# Patient Record
Sex: Female | Born: 1970 | Race: Black or African American | Hispanic: No | Marital: Single | Smoking: Never smoker
Health system: Southern US, Community
[De-identification: ages and names within clinical notes are randomized; demographics above are authoritative.]

## PROBLEM LIST (undated history)

## (undated) DIAGNOSIS — B0221 Postherpetic geniculate ganglionitis: Secondary | ICD-10-CM

## (undated) DIAGNOSIS — D249 Benign neoplasm of unspecified breast: Secondary | ICD-10-CM

## (undated) DIAGNOSIS — R011 Cardiac murmur, unspecified: Secondary | ICD-10-CM

## (undated) DIAGNOSIS — K219 Gastro-esophageal reflux disease without esophagitis: Secondary | ICD-10-CM

## (undated) DIAGNOSIS — I10 Essential (primary) hypertension: Secondary | ICD-10-CM

## (undated) DIAGNOSIS — R519 Headache, unspecified: Secondary | ICD-10-CM

## (undated) DIAGNOSIS — B029 Zoster without complications: Secondary | ICD-10-CM

## (undated) HISTORY — PX: NO PAST SURGERIES: SHX2092

## (undated) HISTORY — DX: Benign neoplasm of unspecified breast: D24.9

## (undated) HISTORY — DX: Cardiac murmur, unspecified: R01.1

## (undated) HISTORY — PX: ENDOMETRIAL CRYOABLATION: SHX1499

---

## 1998-05-28 ENCOUNTER — Other Ambulatory Visit: Admission: RE | Admit: 1998-05-28 | Discharge: 1998-05-28 | Payer: Self-pay | Admitting: Emergency Medicine

## 1999-06-06 ENCOUNTER — Other Ambulatory Visit: Admission: RE | Admit: 1999-06-06 | Discharge: 1999-06-06 | Payer: Self-pay | Admitting: Family Medicine

## 1999-08-04 ENCOUNTER — Emergency Department (HOSPITAL_COMMUNITY): Admission: EM | Admit: 1999-08-04 | Discharge: 1999-08-04 | Payer: Self-pay | Admitting: *Deleted

## 2000-06-10 ENCOUNTER — Other Ambulatory Visit: Admission: RE | Admit: 2000-06-10 | Discharge: 2000-06-10 | Payer: Self-pay | Admitting: Family Medicine

## 2001-06-21 ENCOUNTER — Other Ambulatory Visit: Admission: RE | Admit: 2001-06-21 | Discharge: 2001-06-21 | Payer: Self-pay | Admitting: Family Medicine

## 2002-06-24 ENCOUNTER — Other Ambulatory Visit: Admission: RE | Admit: 2002-06-24 | Discharge: 2002-06-24 | Payer: Self-pay | Admitting: Family Medicine

## 2003-06-26 ENCOUNTER — Other Ambulatory Visit: Admission: RE | Admit: 2003-06-26 | Discharge: 2003-06-26 | Payer: Self-pay | Admitting: Family Medicine

## 2005-02-05 ENCOUNTER — Other Ambulatory Visit: Admission: RE | Admit: 2005-02-05 | Discharge: 2005-02-05 | Payer: Self-pay | Admitting: Family Medicine

## 2006-02-18 ENCOUNTER — Other Ambulatory Visit: Admission: RE | Admit: 2006-02-18 | Discharge: 2006-02-18 | Payer: Self-pay | Admitting: Family Medicine

## 2007-02-22 ENCOUNTER — Other Ambulatory Visit: Admission: RE | Admit: 2007-02-22 | Discharge: 2007-02-22 | Payer: Self-pay | Admitting: Family Medicine

## 2008-02-23 ENCOUNTER — Other Ambulatory Visit: Admission: RE | Admit: 2008-02-23 | Discharge: 2008-02-23 | Payer: Self-pay | Admitting: Family Medicine

## 2011-01-14 HISTORY — PX: BREAST EXCISIONAL BIOPSY: SUR124

## 2011-04-30 ENCOUNTER — Other Ambulatory Visit: Payer: Self-pay | Admitting: Family Medicine

## 2011-04-30 DIAGNOSIS — Z1231 Encounter for screening mammogram for malignant neoplasm of breast: Secondary | ICD-10-CM

## 2011-05-22 ENCOUNTER — Ambulatory Visit: Payer: Self-pay

## 2011-05-29 ENCOUNTER — Ambulatory Visit
Admission: RE | Admit: 2011-05-29 | Discharge: 2011-05-29 | Disposition: A | Discharge status: Home / Self Care | Payer: Federal, State, Local not specified - PPO | Source: Ambulatory Visit | Attending: Family Medicine | Admitting: Family Medicine

## 2011-05-29 DIAGNOSIS — Z1231 Encounter for screening mammogram for malignant neoplasm of breast: Secondary | ICD-10-CM

## 2011-06-11 ENCOUNTER — Other Ambulatory Visit: Payer: Self-pay | Admitting: Family Medicine

## 2011-06-11 DIAGNOSIS — R928 Other abnormal and inconclusive findings on diagnostic imaging of breast: Secondary | ICD-10-CM

## 2011-06-20 ENCOUNTER — Ambulatory Visit
Admission: RE | Admit: 2011-06-20 | Discharge: 2011-06-20 | Disposition: A | Discharge status: Home / Self Care | Payer: Federal, State, Local not specified - PPO | Source: Ambulatory Visit | Attending: Family Medicine | Admitting: Family Medicine

## 2011-06-20 DIAGNOSIS — R928 Other abnormal and inconclusive findings on diagnostic imaging of breast: Secondary | ICD-10-CM

## 2011-07-15 ENCOUNTER — Encounter (INDEPENDENT_AMBULATORY_CARE_PROVIDER_SITE_OTHER): Payer: Self-pay | Admitting: Surgery

## 2011-07-15 ENCOUNTER — Ambulatory Visit (INDEPENDENT_AMBULATORY_CARE_PROVIDER_SITE_OTHER): Payer: Federal, State, Local not specified - PPO | Admitting: Surgery

## 2011-07-15 VITALS — BP 132/86 | HR 70 | Temp 97.4°F | Resp 14 | Ht 63.5 in | Wt 186.5 lb

## 2011-07-15 DIAGNOSIS — N63 Unspecified lump in unspecified breast: Secondary | ICD-10-CM

## 2011-07-15 NOTE — Patient Instructions (Addendum)
We will schedule the surgery to remove the small lump from your left breast. If you have any questions he has before surgery please be sure to call the office.

## 2011-07-15 NOTE — Progress Notes (Signed)
NAME: Caroline Novak DOB: 05-Jun-1970 MRN: [id number]                                                                                      DATE: 07/15/2011  PCP: Joycelyn Rua, MD Referring Provider: Henrene Pastor, MD  IMPRESSION:  Left breast mass, 6:00 position, probable fibroadenoma  PLAN:   Excision. This is symptomatic and she does not wish to have a biopsy since it looks benign by mammogram and ultrasound criteria and she wishes to have it removed whether or not she has a benign biopsy. I've discussed the surgery risks and center with her and all questions have been answered.                 CC:  Chief Complaint  Patient presents with  . Breast Mass    Fibroadenoma    HPI:  Caroline Novak is a 41 y.o.  female who presents for evaluation of Left breast mass.Skin present over 3 years.  she has several masses 2 on the left side and one on the right side. The one in the 6:00 position on the left has been uncomfortable it bothers her. She has had mammograms and ultrasounds and this really appears to be a solid tumor likely a fibroadenoma or a fibro-adenoma lipoma. The other 2 masses appear to be simple cysts. She's had no prior breast problems. PMH:  has a past medical history of Heart murmur and Fibroadenoma of breast.  PSH:   has no past surgical history on file.  ALLERGIES:  No Known Allergies  MEDICATIONS: Current outpatient prescriptions:lisinopril-hydrochlorothiazide (PRINZIDE,ZESTORETIC) 10-12.5 MG per tablet, Daily., Disp: , Rfl: ;  NON FORMULARY, daily. Synedrex - diet supplement., Disp: , Rfl: ;  TRI-PREVIFEM 0.18/0.215/0.25 MG-35 MCG tablet, Daily., Disp: , Rfl:   ROS: Basically negative.  EXAM:   VITAL SIGNS: BP 132/86  Pulse 70  Temp 97.4 F (36.3 C) (Temporal)  Resp 14  Ht 5' 3.5" (1.613 m)  Wt 186 lb 8 oz (84.596 kg)  BMI 32.52 kg/m2 GENERAL:  The patient is alert, oriented, and generally healthy-appearing, NAD. Mood and affect are normal.  HEENT:  The  head is normocephalic, the eyes nonicteric, the pupils were round regular and equal. EOMs are normal. Pharynx normal. Dentition good.  NECK:  The neck is supple and there are no masses or thyromegaly.  LUNGS: Normal respirations and clear to auscultation.  HEART: Regular rhythm, with no murmurs rubs or gallops. Pulses are intact carotid dorsalis pedis and posterior tibial. No significant varicosities are noted.  BREASTS: The breasts are symmetric in size and shape. There is a well-circumscribed 2 cm mass in the right breast about the 1:30 position about 2 fingerbreadths above the areolar margin. It is consistent with a cyst. There is an almost mirror image mass in the left breast. In the left breast there is another mass in the 6:00 position which is about 1.5 cm, well circumscribed, mobile, slightly tender.  LYMPHATICS: There is no axillary or supraclavicular adenopathy noted.  ABDOMEN: Soft, flat, and nontender. No masses or organomegaly is noted. No hernias are noted. Bowel sounds are normal.  EXTREMITIES:  Good range of motion, no edema.   DATA REVIEWED:  Mammogram and ultrasound reports are noted and reviewed.    Annastasia Haskins J 07/15/2011  CC: Brozzetti, Robinette Haines, MD, Joycelyn Rua, MD

## 2011-08-13 ENCOUNTER — Encounter (HOSPITAL_BASED_OUTPATIENT_CLINIC_OR_DEPARTMENT_OTHER): Payer: Self-pay | Admitting: *Deleted

## 2011-08-13 NOTE — Progress Notes (Signed)
To come in for bmet-ekg for htn

## 2011-08-18 ENCOUNTER — Encounter (HOSPITAL_BASED_OUTPATIENT_CLINIC_OR_DEPARTMENT_OTHER): Payer: Self-pay | Admitting: *Deleted

## 2011-08-18 ENCOUNTER — Encounter (HOSPITAL_BASED_OUTPATIENT_CLINIC_OR_DEPARTMENT_OTHER)
Admission: RE | Admit: 2011-08-18 | Discharge: 2011-08-18 | Disposition: A | Discharge status: Home / Self Care | Payer: Federal, State, Local not specified - PPO | Source: Ambulatory Visit | Attending: Surgery | Admitting: Surgery

## 2011-08-18 LAB — BASIC METABOLIC PANEL
BUN: 10 mg/dL (ref 6–23)
CO2: 25 mEq/L (ref 19–32)
Calcium: 8.7 mg/dL (ref 8.4–10.5)
Chloride: 101 mEq/L (ref 96–112)
Creatinine, Ser: 0.93 mg/dL (ref 0.50–1.10)
GFR calc Af Amer: 86 mL/min — ABNORMAL LOW (ref >90)
GFR calc non Af Amer: 74 mL/min — ABNORMAL LOW (ref >90)
Glucose, Bld: 85 mg/dL (ref 70–99)
Potassium: 3.4 mEq/L — ABNORMAL LOW (ref 3.5–5.1)
Sodium: 137 mEq/L (ref 135–145)

## 2011-08-19 ENCOUNTER — Encounter (HOSPITAL_BASED_OUTPATIENT_CLINIC_OR_DEPARTMENT_OTHER): Payer: Self-pay | Admitting: Anesthesiology

## 2011-08-19 ENCOUNTER — Ambulatory Visit (HOSPITAL_BASED_OUTPATIENT_CLINIC_OR_DEPARTMENT_OTHER): Payer: Federal, State, Local not specified - PPO | Admitting: Anesthesiology

## 2011-08-19 ENCOUNTER — Ambulatory Visit (HOSPITAL_BASED_OUTPATIENT_CLINIC_OR_DEPARTMENT_OTHER)
Admission: RE | Admit: 2011-08-19 | Discharge: 2011-08-19 | Disposition: A | Discharge status: Home / Self Care | Payer: Federal, State, Local not specified - PPO | Source: Ambulatory Visit | Attending: Surgery | Admitting: Surgery

## 2011-08-19 ENCOUNTER — Encounter (HOSPITAL_BASED_OUTPATIENT_CLINIC_OR_DEPARTMENT_OTHER)
Admission: RE | Disposition: A | Discharge status: Home / Self Care | Payer: Self-pay | Source: Ambulatory Visit | Attending: Surgery

## 2011-08-19 ENCOUNTER — Encounter (HOSPITAL_BASED_OUTPATIENT_CLINIC_OR_DEPARTMENT_OTHER): Payer: Self-pay | Admitting: *Deleted

## 2011-08-19 DIAGNOSIS — D249 Benign neoplasm of unspecified breast: Secondary | ICD-10-CM

## 2011-08-19 DIAGNOSIS — N63 Unspecified lump in unspecified breast: Secondary | ICD-10-CM

## 2011-08-19 DIAGNOSIS — Z01812 Encounter for preprocedural laboratory examination: Secondary | ICD-10-CM | POA: Insufficient documentation

## 2011-08-19 DIAGNOSIS — I10 Essential (primary) hypertension: Secondary | ICD-10-CM | POA: Insufficient documentation

## 2011-08-19 HISTORY — DX: Essential (primary) hypertension: I10

## 2011-08-19 HISTORY — PX: BREAST SURGERY: SHX581

## 2011-08-19 LAB — POCT HEMOGLOBIN-HEMACUE
Hemoglobin: 11.4 g/dL — ABNORMAL LOW (ref 12.0–15.0)
Hemoglobin: 9.6 g/dL — ABNORMAL LOW (ref 12.0–15.0)

## 2011-08-19 SURGERY — EXCISION OF BREAST BIOPSY
Anesthesia: General | Site: Breast | Laterality: Left | Wound class: Clean

## 2011-08-19 MED ORDER — PROPOFOL 10 MG/ML IV EMUL
INTRAVENOUS | Status: DC | PRN
  Administered 2011-08-19: 250 mg via INTRAVENOUS

## 2011-08-19 MED ORDER — HYDROCODONE-ACETAMINOPHEN 5-325 MG PO TABS: 1.0000 | ORAL_TABLET | ORAL | Status: AC | PRN

## 2011-08-19 MED ORDER — CEFAZOLIN SODIUM-DEXTROSE 2-3 GM-% IV SOLR
2.0000 g | INTRAVENOUS | Status: AC
  Administered 2011-08-19: 2 g via INTRAVENOUS

## 2011-08-19 MED ORDER — MIDAZOLAM HCL 5 MG/5ML IJ SOLN
INTRAMUSCULAR | Status: DC | PRN
  Administered 2011-08-19: 2 mg via INTRAVENOUS

## 2011-08-19 MED ORDER — BUPIVACAINE HCL (PF) 0.25 % IJ SOLN
INTRAMUSCULAR | Status: DC | PRN
  Administered 2011-08-19: 20 mL

## 2011-08-19 MED ORDER — CHLORHEXIDINE GLUCONATE 4 % EX LIQD: 1.0000 "application " | Freq: Once | CUTANEOUS | Status: DC

## 2011-08-19 MED ORDER — DEXAMETHASONE SODIUM PHOSPHATE 4 MG/ML IJ SOLN
INTRAMUSCULAR | Status: DC | PRN
  Administered 2011-08-19: 10 mg via INTRAVENOUS

## 2011-08-19 MED ORDER — SCOPOLAMINE 1 MG/3DAYS TD PT72
1.0000 | MEDICATED_PATCH | Freq: Once | TRANSDERMAL | Status: DC
  Administered 2011-08-19: 1.5 mg via TRANSDERMAL

## 2011-08-19 MED ORDER — OXYCODONE HCL 5 MG PO TABS: 5.0000 mg | ORAL_TABLET | Freq: Once | ORAL | Status: DC | PRN

## 2011-08-19 MED ORDER — ACETAMINOPHEN 10 MG/ML IV SOLN
1000.0000 mg | Freq: Once | INTRAVENOUS | Status: AC
  Administered 2011-08-19: 1000 mg via INTRAVENOUS

## 2011-08-19 MED ORDER — OXYCODONE HCL 5 MG/5ML PO SOLN: 5.0000 mg | Freq: Once | ORAL | Status: DC | PRN

## 2011-08-19 MED ORDER — HYDROMORPHONE HCL PF 1 MG/ML IJ SOLN: 0.2500 mg | INTRAMUSCULAR | Status: DC | PRN

## 2011-08-19 MED ORDER — ONDANSETRON HCL 4 MG/2ML IJ SOLN
INTRAMUSCULAR | Status: DC | PRN
  Administered 2011-08-19: 4 mg via INTRAVENOUS

## 2011-08-19 MED ORDER — FENTANYL CITRATE 0.05 MG/ML IJ SOLN
INTRAMUSCULAR | Status: DC | PRN
  Administered 2011-08-19: 50 ug via INTRAVENOUS

## 2011-08-19 MED ORDER — LIDOCAINE HCL (CARDIAC) 20 MG/ML IV SOLN
INTRAVENOUS | Status: DC | PRN
  Administered 2011-08-19: 40 mg via INTRAVENOUS

## 2011-08-19 MED ORDER — METOCLOPRAMIDE HCL 5 MG/ML IJ SOLN: 10.0000 mg | Freq: Once | INTRAMUSCULAR | Status: DC | PRN

## 2011-08-19 MED ORDER — LACTATED RINGERS IV SOLN
INTRAVENOUS | Status: DC
  Administered 2011-08-19: 07:00:00 via INTRAVENOUS

## 2011-08-19 SURGICAL SUPPLY — 50 items
ADH SKN CLS APL DERMABOND .7 (GAUZE/BANDAGES/DRESSINGS) ×1
APPLICATOR COTTON TIP 6IN STRL (MISCELLANEOUS) IMPLANT
BINDER BREAST LRG (GAUZE/BANDAGES/DRESSINGS) IMPLANT
BINDER BREAST MEDIUM (GAUZE/BANDAGES/DRESSINGS) IMPLANT
BINDER BREAST XLRG (GAUZE/BANDAGES/DRESSINGS) IMPLANT
BINDER BREAST XXLRG (GAUZE/BANDAGES/DRESSINGS) IMPLANT
BLADE HEX COATED 2.75 (ELECTRODE) ×2 IMPLANT
BLADE SURG 15 STRL LF DISP TIS (BLADE) ×1 IMPLANT
BLADE SURG 15 STRL SS (BLADE) ×2
CANISTER SUCTION 1200CC (MISCELLANEOUS) ×2 IMPLANT
CHLORAPREP W/TINT 26ML (MISCELLANEOUS) ×2 IMPLANT
CLIP TI MEDIUM 6 (CLIP) IMPLANT
CLIP TI WIDE RED SMALL 6 (CLIP) IMPLANT
CLOTH BEACON ORANGE TIMEOUT ST (SAFETY) ×2 IMPLANT
COVER MAYO STAND STRL (DRAPES) ×2 IMPLANT
COVER TABLE BACK 60X90 (DRAPES) ×2 IMPLANT
DECANTER SPIKE VIAL GLASS SM (MISCELLANEOUS) IMPLANT
DERMABOND ADVANCED (GAUZE/BANDAGES/DRESSINGS) ×1
DERMABOND ADVANCED .7 DNX12 (GAUZE/BANDAGES/DRESSINGS) ×1 IMPLANT
DEVICE DUBIN W/COMP PLATE 8390 (MISCELLANEOUS) IMPLANT
DRAPE LAPAROTOMY TRNSV 102X78 (DRAPE) ×2 IMPLANT
DRAPE UTILITY XL STRL (DRAPES) ×2 IMPLANT
ELECT REM PT RETURN 9FT ADLT (ELECTROSURGICAL) ×2
ELECTRODE REM PT RTRN 9FT ADLT (ELECTROSURGICAL) ×1 IMPLANT
GLOVE BIO SURGEON STRL SZ7 (GLOVE) ×1 IMPLANT
GLOVE BIO SURGEON STRL SZ8 (GLOVE) ×1 IMPLANT
GLOVE BIOGEL PI IND STRL 7.5 (GLOVE) IMPLANT
GLOVE BIOGEL PI INDICATOR 7.5 (GLOVE) ×2
GLOVE EUDERMIC 7 POWDERFREE (GLOVE) ×2 IMPLANT
GOWN PREVENTION PLUS XLARGE (GOWN DISPOSABLE) ×4 IMPLANT
GOWN PREVENTION PLUS XXLARGE (GOWN DISPOSABLE) ×1 IMPLANT
KIT MARKER MARGIN INK (KITS) IMPLANT
NDL HYPO 25X1 1.5 SAFETY (NEEDLE) ×1 IMPLANT
NEEDLE HYPO 25X1 1.5 SAFETY (NEEDLE) ×2 IMPLANT
NS IRRIG 1000ML POUR BTL (IV SOLUTION) ×1 IMPLANT
PACK BASIN DAY SURGERY FS (CUSTOM PROCEDURE TRAY) ×2 IMPLANT
PENCIL BUTTON HOLSTER BLD 10FT (ELECTRODE) ×2 IMPLANT
SLEEVE SCD COMPRESS KNEE MED (MISCELLANEOUS) ×2 IMPLANT
SPONGE GAUZE 4X4 12PLY (GAUZE/BANDAGES/DRESSINGS) IMPLANT
SPONGE INTESTINAL PEANUT (DISPOSABLE) IMPLANT
SPONGE LAP 4X18 X RAY DECT (DISPOSABLE) ×2 IMPLANT
STAPLER VISISTAT 35W (STAPLE) IMPLANT
SUT MNCRL AB 4-0 PS2 18 (SUTURE) ×2 IMPLANT
SUT SILK 0 TIES 10X30 (SUTURE) IMPLANT
SUT VICRYL 3-0 CR8 SH (SUTURE) ×2 IMPLANT
SYR CONTROL 10ML LL (SYRINGE) ×2 IMPLANT
TOWEL OR NON WOVEN STRL DISP B (DISPOSABLE) ×2 IMPLANT
TUBE CONNECTING 20X1/4 (TUBING) ×2 IMPLANT
WATER STERILE IRR 1000ML POUR (IV SOLUTION) ×1 IMPLANT
YANKAUER SUCT BULB TIP NO VENT (SUCTIONS) ×2 IMPLANT

## 2011-08-19 NOTE — H&P (Signed)
  HPI: Caroline Novak is a 41 y.o. female who presents for removal of Left breast mass.It has been present over 3 years. It causes discomfort, looks benign on sono, but she would like it removed. She declined NCB as she wishes excision evenif benign. She's had no prior breast problems.  PMH: has a past medical history of Heart murmur and Fibroadenoma of breast.  PSH: has no past surgical history on file.  ALLERGIES: No Known Allergies  MEDICATIONS: Current outpatient prescriptions:lisinopril-hydrochlorothiazide (PRINZIDE,ZESTORETIC) 10-12.5 MG per tablet, Daily., Disp: , Rfl: ; NON FORMULARY, daily. Synedrex - diet supplement., Disp: , Rfl: ; TRI-PREVIFEM 0.18/0.215/0.25 MG-35 MCG tablet, Daily., Disp: , Rfl:  ROS: Basically negative.  EXAM:  VITAL SIGNS: BP 132/86  Pulse 70  Temp 97.4 F (36.3 C) (Temporal)  Resp 14  Ht 5' 3.5" (1.613 m)  Wt 186 lb 8 oz (84.596 kg)  BMI 32.52 kg/m2  GENERAL:  The patient is alert, oriented, and generally healthy-appearing, NAD. Mood and affect are normal.  HEENT:  The head is normocephalic, the eyes nonicteric, the pupils were round regular and equal. EOMs are normal. Pharynx normal. Dentition good.  NECK:  The neck is supple and there are no masses or thyromegaly.  LUNGS:  Normal respirations and clear to auscultation.  HEART:  Regular rhythm, with no murmurs rubs or gallops. Pulses are intact carotid dorsalis pedis and posterior tibial. No significant varicosities are noted.  BREASTS:  The breasts are symmetric in size and shape. There is a well-circumscribed 2 cm mass in the right breast about the 1:30 position about 2 fingerbreadths above the areolar margin. It is consistent with a cyst. There is an almost mirror image mass in the left breast. In the left breast there is another mass in the 6:00 position which is about 1.5 cm, well circumscribed, mobile, slightly tender.  LYMPHATICS:  There is no axillary or supraclavicular adenopathy noted.    ABDOMEN:  Soft, flat, and nontender. No masses or organomegaly is noted. No hernias are noted. Bowel sounds are normal.  EXTREMITIES:  Good range of motion, no edema.  DATA REVIEWED: Mammogram and ultrasound reports are noted and reviewed.  I have reviewed the procedure with the patient and answered questions. The left side is marked as the operative side.

## 2011-08-19 NOTE — Op Note (Signed)
Caroline Novak 26-Mar-1970 [id number] 07/15/2011  Preoperative diagnosis: Left breast mass, probable fibroadenoma, 6:00 location.  Postoperative diagnosis: Same  Procedure: Removal left breast mass  Surgeon: Currie Paris, MD, FACS  Assistant: Charissa Bash, MS III  Anesthesia: General   Clinical History and Indications: This patient has a mass in the left breast about 2 cm in size which is somewhat tender and occasionally uncomfortable. By ultrasound appeared to be a benign fibroadenoma. Because of the symptoms she wished to have it removed.    Description of Procedure: I saw the patient preprocedure area and confirmed the plans and marked the left breast as the operative site. In the operating room, prior to any anesthesia administration, the mass was identified and marked with the patient's cooperation.  After satisfactory general anesthesia was obtained the left breast was prepped and draped and a timeout was done. I made a curvilinear incision over the mass and divided the subcutaneous fatty tissue with cautery. The mass was visualized and excised with the cautery intact and in toto. Bleeders were electrocoagulated. 20 cc of 0.25% plain Marcaine was infiltrated for postop anesthesia.  The incision was closed in layers. Skin was closed with 4-0 Monocryl subcuticular and Dermabond. The patient tolerated the procedure well. There no operative complications. Blood loss was minimal. All counts were correct. Specimen was sent to pathology for permanent examination.  Currie Paris, MD, FACS 08/19/2011 8:25 AM

## 2011-08-19 NOTE — Transfer of Care (Signed)
Immediate Anesthesia Transfer of Care Note  Patient: Caroline Novak  Procedure(s) Performed: Procedure(s) (LRB): EXCISION OF BREAST BIOPSY (Left)  Patient Location: PACU  Anesthesia Type: General  Level of Consciousness: awake and alert   Airway & Oxygen Therapy: Patient Spontanous Breathing and Patient connected to face mask oxygen  Post-op Assessment: Report given to PACU RN and Post -op Vital signs reviewed and stable  Post vital signs: Reviewed and stable  Complications: No apparent anesthesia complications

## 2011-08-19 NOTE — Anesthesia Procedure Notes (Signed)
Procedure Name: LMA Insertion Performed by: Blenda Wisecup W Pre-anesthesia Checklist: Patient identified, Timeout performed, Emergency Drugs available, Suction available and Patient being monitored Patient Re-evaluated:Patient Re-evaluated prior to inductionOxygen Delivery Method: Circle system utilized Preoxygenation: Pre-oxygenation with 100% oxygen Intubation Type: IV induction Ventilation: Mask ventilation without difficulty LMA: LMA with gastric port inserted LMA Size: 4.0 Number of attempts: 1 Tube secured with: Tape Dental Injury: Teeth and Oropharynx as per pre-operative assessment      

## 2011-08-19 NOTE — Anesthesia Postprocedure Evaluation (Signed)
Anesthesia Post Note  Patient: Caroline Novak  Procedure(s) Performed: Procedure(s) (LRB): EXCISION OF BREAST BIOPSY (Left)  Anesthesia type: General  Patient location: PACU  Post pain: Pain level controlled  Post assessment: Patient's Cardiovascular Status Stable  Last Vitals:  Filed Vitals:   08/19/11 0907  BP: 124/79  Pulse: 66  Temp:   Resp: 16    Post vital signs: Reviewed and stable  Level of consciousness: alert  Complications: No apparent anesthesia complications

## 2011-08-19 NOTE — Anesthesia Preprocedure Evaluation (Signed)
Anesthesia Evaluation  Patient identified by MRN, date of birth, ID band Patient awake    Reviewed: Allergy & Precautions, H&P , NPO status , Patient's Chart, lab work & pertinent test results, reviewed documented beta blocker date and time   Airway Mallampati: II TM Distance: >3 FB Neck ROM: full    Dental   Pulmonary neg pulmonary ROS,  breath sounds clear to auscultation        Cardiovascular hypertension, Pt. on medications negative cardio ROS  + Valvular Problems/Murmurs Rhythm:regular     Neuro/Psych negative neurological ROS  negative psych ROS   GI/Hepatic negative GI ROS, Neg liver ROS,   Endo/Other  negative endocrine ROS  Renal/GU negative Renal ROS  negative genitourinary   Musculoskeletal   Abdominal   Peds  Hematology negative hematology ROS (+)   Anesthesia Other Findings See surgeon's H&P   Reproductive/Obstetrics negative OB ROS                           Anesthesia Physical Anesthesia Plan  ASA: II  Anesthesia Plan: General   Post-op Pain Management:    Induction: Intravenous  Airway Management Planned: LMA  Additional Equipment:   Intra-op Plan:   Post-operative Plan: Extubation in OR  Informed Consent: I have reviewed the patients History and Physical, chart, labs and discussed the procedure including the risks, benefits and alternatives for the proposed anesthesia with the patient or authorized representative who has indicated his/her understanding and acceptance.   Dental Advisory Given  Plan Discussed with: CRNA and Surgeon  Anesthesia Plan Comments:         Anesthesia Quick Evaluation

## 2011-08-20 ENCOUNTER — Telehealth (INDEPENDENT_AMBULATORY_CARE_PROVIDER_SITE_OTHER): Payer: Self-pay | Admitting: General Surgery

## 2011-08-20 NOTE — Telephone Encounter (Signed)
LMOM for patient to call back and ask for me.

## 2011-08-20 NOTE — Telephone Encounter (Signed)
Patient made aware of path results. Will follow up at appt and call with any questions prior.  

## 2011-08-20 NOTE — Telephone Encounter (Signed)
Message copied by Liliana Cline on Wed Aug 20, 2011 11:58 AM ------      Message from: Currie Paris      Created: Wed Aug 20, 2011 11:51 AM       Tell her path is benign and as expected

## 2011-09-02 ENCOUNTER — Ambulatory Visit (INDEPENDENT_AMBULATORY_CARE_PROVIDER_SITE_OTHER): Payer: Federal, State, Local not specified - PPO | Admitting: Surgery

## 2011-09-02 ENCOUNTER — Encounter (INDEPENDENT_AMBULATORY_CARE_PROVIDER_SITE_OTHER): Payer: Self-pay | Admitting: Surgery

## 2011-09-02 VITALS — BP 140/86 | HR 76 | Temp 97.0°F | Ht 63.5 in | Wt 184.8 lb

## 2011-09-02 DIAGNOSIS — N63 Unspecified lump in unspecified breast: Secondary | ICD-10-CM

## 2011-09-02 NOTE — Progress Notes (Signed)
NAME: Caroline Novak                                            DOB: [birth year] DATE: 09/02/2011                                                  MRN: [id number]  CC: Post op   HPI: This patient comes in for post op follow-up.Sheunderwent Removal of a left breast mass on 08/19/2011. She feels that she is doing well.She notes that  Menstrual cycle pain that she previously had is now resolved with removal of the fibroadenoma.  PE:  VITAL SIGNS: BP 140/86  Pulse 76  Temp 97 F (36.1 C) (Temporal)  Ht 5' 3.5" (1.613 m)  Wt 184 lb 12.8 oz (83.825 kg)  BMI 32.22 kg/m2  LMP 07/03/2011  General: The patient appears to be healthy, NAD   DATA REVIEWED: Pathology report shows a 3.5 cm benign fibroadenoma  IMPRESSION: The patient is doing well S/P Removal of fibroadenoma.    PLAN: She will come back on a when necessary basis. I reviewed the pathology report with her and gave her a copy for her records.

## 2011-09-02 NOTE — Patient Instructions (Signed)
We will see you again on an as needed basis. Please call the office at [phone] if you have any questions or concerns. Thank you for allowing us to take care of you.  

## 2011-09-18 ENCOUNTER — Other Ambulatory Visit: Payer: Self-pay | Admitting: Obstetrics and Gynecology

## 2011-09-18 DIAGNOSIS — R109 Unspecified abdominal pain: Secondary | ICD-10-CM

## 2011-09-19 ENCOUNTER — Ambulatory Visit
Admission: RE | Admit: 2011-09-19 | Discharge: 2011-09-19 | Disposition: A | Discharge status: Home / Self Care | Payer: Federal, State, Local not specified - PPO | Source: Ambulatory Visit | Attending: Obstetrics and Gynecology | Admitting: Obstetrics and Gynecology

## 2011-09-19 DIAGNOSIS — R109 Unspecified abdominal pain: Secondary | ICD-10-CM

## 2011-09-19 MED ORDER — IOHEXOL 300 MG/ML  SOLN
100.0000 mL | Freq: Once | INTRAMUSCULAR | Status: AC | PRN
  Administered 2011-09-19: 100 mL via INTRAVENOUS

## 2013-05-04 ENCOUNTER — Other Ambulatory Visit: Payer: Self-pay | Admitting: Family Medicine

## 2013-05-04 DIAGNOSIS — N63 Unspecified lump in unspecified breast: Secondary | ICD-10-CM

## 2013-05-12 ENCOUNTER — Ambulatory Visit
Admission: RE | Admit: 2013-05-12 | Discharge: 2013-05-12 | Disposition: A | Discharge status: Home / Self Care | Payer: Federal, State, Local not specified - PPO | Source: Ambulatory Visit | Attending: Family Medicine | Admitting: Family Medicine

## 2013-05-12 DIAGNOSIS — N63 Unspecified lump in unspecified breast: Secondary | ICD-10-CM

## 2013-07-29 ENCOUNTER — Other Ambulatory Visit: Payer: Self-pay | Admitting: Gastroenterology

## 2013-07-29 DIAGNOSIS — R112 Nausea with vomiting, unspecified: Secondary | ICD-10-CM

## 2013-08-04 ENCOUNTER — Ambulatory Visit
Admission: RE | Admit: 2013-08-04 | Discharge: 2013-08-04 | Disposition: A | Discharge status: Home / Self Care | Payer: Federal, State, Local not specified - PPO | Source: Ambulatory Visit | Attending: Gastroenterology | Admitting: Gastroenterology

## 2013-08-04 DIAGNOSIS — R112 Nausea with vomiting, unspecified: Secondary | ICD-10-CM

## 2014-05-08 ENCOUNTER — Other Ambulatory Visit: Payer: Self-pay

## 2014-05-08 DIAGNOSIS — Z1231 Encounter for screening mammogram for malignant neoplasm of breast: Secondary | ICD-10-CM

## 2014-05-15 ENCOUNTER — Ambulatory Visit
Admission: RE | Admit: 2014-05-15 | Discharge: 2014-05-15 | Disposition: A | Discharge status: Home / Self Care | Payer: Federal, State, Local not specified - PPO | Source: Ambulatory Visit

## 2014-05-15 ENCOUNTER — Other Ambulatory Visit: Payer: Self-pay | Admitting: Obstetrics & Gynecology

## 2014-05-15 DIAGNOSIS — Z1231 Encounter for screening mammogram for malignant neoplasm of breast: Secondary | ICD-10-CM

## 2014-07-06 ENCOUNTER — Encounter (HOSPITAL_COMMUNITY): Payer: Self-pay

## 2014-07-06 ENCOUNTER — Encounter (HOSPITAL_COMMUNITY)
Admission: RE | Admit: 2014-07-06 | Discharge: 2014-07-06 | Disposition: A | Discharge status: Home / Self Care | Payer: Federal, State, Local not specified - PPO | Source: Ambulatory Visit | Attending: Obstetrics & Gynecology | Admitting: Obstetrics & Gynecology

## 2014-07-06 ENCOUNTER — Other Ambulatory Visit: Payer: Self-pay

## 2014-07-06 DIAGNOSIS — Z01818 Encounter for other preprocedural examination: Secondary | ICD-10-CM | POA: Insufficient documentation

## 2014-07-06 LAB — BASIC METABOLIC PANEL
Anion gap: 11 (ref 5–15)
BUN: 14 mg/dL (ref 6–20)
CO2: 25 mmol/L (ref 22–32)
Calcium: 8.9 mg/dL (ref 8.9–10.3)
Chloride: 104 mmol/L (ref 101–111)
Creatinine, Ser: 0.92 mg/dL (ref 0.44–1.00)
GFR calc Af Amer: >60 mL/min (ref >60)
GFR calc non Af Amer: >60 mL/min (ref >60)
Glucose, Bld: 94 mg/dL (ref 65–99)
Potassium: 3.7 mmol/L (ref 3.5–5.1)
Sodium: 140 mmol/L (ref 135–145)

## 2014-07-06 LAB — CBC
HCT: 34.8 % — ABNORMAL LOW (ref 36.0–46.0)
Hemoglobin: 11 g/dL — ABNORMAL LOW (ref 12.0–15.0)
MCH: 25.3 pg — ABNORMAL LOW (ref 26.0–34.0)
MCHC: 31.6 g/dL (ref 30.0–36.0)
MCV: 80.2 fL (ref 78.0–100.0)
Platelets: 298 10*3/uL (ref 150–400)
RBC: 4.34 MIL/uL (ref 3.87–5.11)
RDW: 20.4 % — ABNORMAL HIGH (ref 11.5–15.5)
WBC: 5.6 10*3/uL (ref 4.0–10.5)

## 2014-07-06 LAB — ABO/RH: ABO/RH(D): O POS

## 2014-07-06 LAB — TYPE AND SCREEN
ABO/RH(D): O POS
Antibody Screen: NEGATIVE

## 2014-07-06 NOTE — Anesthesia Preprocedure Evaluation (Addendum)
Anesthesia Evaluation  Patient identified by MRN, date of birth, ID band Patient awake    Reviewed: Allergy & Precautions, H&P , NPO status , Patient's Chart, lab work & pertinent test results, reviewed documented beta blocker date and time   Airway Mallampati: II  TM Distance: >3 FB Neck ROM: full    Dental   Pulmonary neg pulmonary ROS,  breath sounds clear to auscultation        Cardiovascular hypertension, Pt. on medications negative cardio ROS  + Valvular Problems/Murmurs Rhythm:regular     Neuro/Psych negative neurological ROS  negative psych ROS   GI/Hepatic negative GI ROS, Neg liver ROS,   Endo/Other  negative endocrine ROS  Renal/GU negative Renal ROS  negative genitourinary   Musculoskeletal   Abdominal   Peds  Hematology negative hematology ROS (+)   Anesthesia Other Findings See surgeon's H&P   Reproductive/Obstetrics negative OB ROS                            Anesthesia Physical Anesthesia Plan  ASA: II  Anesthesia Plan: General   Post-op Pain Management:    Induction: Intravenous  Airway Management Planned: Oral ETT  Additional Equipment:   Intra-op Plan:   Post-operative Plan: Extubation in OR  Informed Consent: I have reviewed the patients History and Physical, chart, labs and discussed the procedure including the risks, benefits and alternatives for the proposed anesthesia with the patient or authorized representative who has indicated his/her understanding and acceptance.     Plan Discussed with:   Anesthesia Plan Comments: (Check am labs)        Anesthesia Quick Evaluation

## 2014-07-06 NOTE — Pre-Procedure Instructions (Signed)
Patient had abnormal EKG at pre op appointment today. EKG reviewed by Dr. Jillyn Hidden and no new orders received.

## 2014-07-06 NOTE — Patient Instructions (Signed)
Your procedure is scheduled on:  July 13, 2014  Enter through the Main Entrance of [facility] at:  0900 am   Pick up the phone at the desk and dial [phone].  Call this number if you have problems the morning of surgery: [phone].  Remember: Do NOT eat food: after midnight on Wednesday  Do NOT drink clear liquids after:   Midnight on Wednesday  Take these medicines the morning of surgery with a SIP OF WATER:  Toprol-XL and Hydrochlorothiazide   Do NOT wear jewelry (body piercing), metal hair clips/bobby pins, make-up, or nail polish. Do NOT wear lotions, powders, or perfumes.  You may wear deoderant. Do NOT shave for 48 hours prior to surgery. Do NOT bring valuables to the hospital. Contacts, or bridgework may not be worn into surgery. Leave suitcase in car.  After surgery it may be brought to your room.  For patients admitted to the hospital, checkout time is 11:00 AM the day of discharge.

## 2014-07-07 NOTE — H&P (Signed)
Caroline Novak is an 44 y.o. female. G1P1 who presents for abdominal hysterectomy and bilateral salpingectomy due to AUB and uterine leiomyomas. In review, the patient reports progressively heavier periods despite being on OCPs to help control her bleeding. During her menses, she will have two days of using both a super tampon & pad changing it every 1-2hours when at work (to avoid an accident). Denies intermenstrual bleeding. +Dysmenorrhea and sense of bloating/heaviness in her abdomen. Of note, the bleeding has led to anemia, with a recent Hgb of 9.9. Denies urinary or bowel complaints. No sexual dysfunction.  Due to failed medical management, the patient has elected to proceed with surgical management.  Work up has revealed the following: US performed 5/9: Uterus measuring 18.2x11.5x14- with multiple fibroids (too numerous to count)- Post: 6x5.8x4.2cm, fundal 5.1x4.9x3.8cm, R post 4.7x4.1x3.7cm, multiple anterior fibroids ~ 3cm in size. Ovaries unremarkable bilaterally. EMB 5/2: Scant fragments-negative for malignancy or hyperplasia.     Gyn History:  Sexual activity not currently sexually active.  Periods : every month.  LMP 06/29/14.  Birth control ocp.  Last pap smear date 05/03/2014 Normal.  Last mammogram date 05/15/2014.  Abnormal pap smear treated with cryo in the 1990s.        OB History:  Pregnancy # 1 live birth, vaginal delivery, boy.     Past Medical History  Diagnosis Date  . Fibroadenoma of breast   . Hypertension   . Heart murmur- normal ECHO 2010     Past Surgical History  Procedure Laterality Date  .     .  cryoablation of cervix    . Breast surgery  08/19/2011    LT br mass removal    Social History:  reports that she has never smoked. She has never used smokeless tobacco. She reports that she does not drink alcohol or use illicit drugs.  Allergies: No Known Allergies   Social History:  General:  Tobacco use  cigarettes: Never smoked Tobacco history last  updated 07/06/2014 no Smoking.  no Alcohol.  Caffeine: yes, very little, tea.  no Recreational drug use.  Diet: yes, portion control, baking meats,, more servings of fruits and veggies daily.  Exercise: yes, Tries to exercise 3 times weekly.  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  Marital Status: single, Single.  Children: 1, Boys.  Religion: none, Baptist.        Medications: T Metoprolol Succinate ER 50 MG Tablet Extended Release 24 Hour 1 tablet Once a day Hydrochlorothiazide 12.5 MG Tablet 1 tablet Once a day Tri-Sprintec(Norgestimate-Ethinyl Estradiol) 0.18/0.215/0.25 MG-35 MCG Tablet 1 tablet Once a day,  Iron 325 (65 Fe) MG Tablet 1 tablet Once a day     ROS   CONSTITUTIONAL:  no Chills. no Fever. no Night sweats.  SKIN:  no Rash. no Hives.  HEENT:  Blurrred vision no. no Double vision.  CARDIOLOGY:  no Chest pain.  RESPIRATORY:  no Shortness of breath. no Cough.  GASTROENTEROLOGY:  no Abdominal pain. no Appetite change. no Change in bowel movements.  UROLOGY:  no Urinary frequency. no Urinary incontinence. no Urinary urgency.  FEMALE REPRODUCTIVE:  no Breast lumps or discharge. no Breast pain.  NEUROLOGY:  no Dizziness. no Headache. no Loss of consciousness.  PSYCHOLOGY:  no Anxiety. no Depression.  HEMATOLOGY/LYMPH:  Using Blood Thinners no.       Physical Exam  Examination performed in office on 6/23:     Vitals: Wt 209, Wt change 8 lb, Ht  64, BMI 35.87, Pulse sitting 76, BP sitting 143/92.       Examination:  General Examination:  GENERAL APPEARANCE alert, oriented, NAD, pleasant.  SKIN: normal, no rash.  LUNGS: clear to auscultation bilaterally, no wheezes, rhonchi, rales.  HEART: no murmurs, regular rate and rhythm.  ABDOMEN: soft and not tender, mobile palpable enlarged uterus ~ 2cm from umbilicus mostly notable on the right, no rebound or guarding.  FEMALE GENITOURINARY: normal external genitalia,  labia - unremarkable, vagina - pink moist mucosa, no lesions or abnormal discharge, cervix - no discharge or lesions or CMT, adnexa - no masses or tenderness, uterus - non-tender and enlarged size on palpation.  EXTREMITIES: no edema present, no calf tenderness bilaterally.       CBC    Component Value Date/Time   WBC 5.6 07/06/2014 1335   RBC 4.34 07/06/2014 1335   HGB 11.0* 07/06/2014 1335   HCT 34.8* 07/06/2014 1335   PLT 298 07/06/2014 1335   MCV 80.2 07/06/2014 1335   MCH 25.3* 07/06/2014 1335   MCHC 31.6 07/06/2014 1335   RDW 20.4* 07/06/2014 1335   EKG: normal sinus rhythm  Assessment/Plan: 44yo G1P1 who presents for abdominal hysterectomy and bilateral salpingectomy due to AUB and uterine leiomyomata  -NPO -LR @ 125cc/hr -SCDs to OR -Ancef 2g IV to OR -Benefit and risk including but not limited to risk of bleeding, infection and injury have been reviewed with patient.  All questions and concerns were addressed and pt wishes to proceed with procedure.  Janyth Pupa, DO [phone] (pager) [phone] (office)

## 2014-07-13 ENCOUNTER — Inpatient Hospital Stay (HOSPITAL_COMMUNITY): Payer: Federal, State, Local not specified - PPO | Admitting: Anesthesiology

## 2014-07-13 ENCOUNTER — Inpatient Hospital Stay (HOSPITAL_COMMUNITY)
Admission: RE | Admit: 2014-07-13 | Discharge: 2014-07-14 | DRG: 743 | Disposition: A | Discharge status: Home / Self Care | Payer: Federal, State, Local not specified - PPO | Source: Ambulatory Visit | Attending: Obstetrics & Gynecology | Admitting: Obstetrics & Gynecology

## 2014-07-13 ENCOUNTER — Encounter (HOSPITAL_COMMUNITY)
Admission: RE | Disposition: A | Discharge status: Home / Self Care | Payer: Self-pay | Source: Ambulatory Visit | Attending: Obstetrics & Gynecology

## 2014-07-13 ENCOUNTER — Encounter (HOSPITAL_COMMUNITY): Payer: Self-pay | Admitting: *Deleted

## 2014-07-13 DIAGNOSIS — Z79899 Other long term (current) drug therapy: Secondary | ICD-10-CM | POA: Diagnosis not present

## 2014-07-13 DIAGNOSIS — I10 Essential (primary) hypertension: Secondary | ICD-10-CM | POA: Diagnosis present

## 2014-07-13 DIAGNOSIS — D250 Submucous leiomyoma of uterus: Principal | ICD-10-CM | POA: Diagnosis present

## 2014-07-13 DIAGNOSIS — N736 Female pelvic peritoneal adhesions (postinfective): Secondary | ICD-10-CM | POA: Diagnosis present

## 2014-07-13 DIAGNOSIS — D259 Leiomyoma of uterus, unspecified: Secondary | ICD-10-CM | POA: Diagnosis present

## 2014-07-13 DIAGNOSIS — D649 Anemia, unspecified: Secondary | ICD-10-CM | POA: Diagnosis present

## 2014-07-13 DIAGNOSIS — N938 Other specified abnormal uterine and vaginal bleeding: Secondary | ICD-10-CM | POA: Diagnosis present

## 2014-07-13 DIAGNOSIS — N939 Abnormal uterine and vaginal bleeding, unspecified: Secondary | ICD-10-CM | POA: Diagnosis present

## 2014-07-13 HISTORY — PX: ABDOMINAL HYSTERECTOMY: SHX81

## 2014-07-13 HISTORY — PX: LYSIS OF ADHESION: SHX5961

## 2014-07-13 HISTORY — PX: BILATERAL SALPINGECTOMY: SHX5743

## 2014-07-13 LAB — PREGNANCY, URINE: Preg Test, Ur: NEGATIVE

## 2014-07-13 LAB — TYPE AND SCREEN
ABO/RH(D): O POS
Antibody Screen: NEGATIVE

## 2014-07-13 SURGERY — HYSTERECTOMY, ABDOMINAL
Anesthesia: General | Site: Abdomen

## 2014-07-13 MED ORDER — KETOROLAC TROMETHAMINE 30 MG/ML IJ SOLN
INTRAMUSCULAR | Status: AC
  Filled 2014-07-13: qty 1

## 2014-07-13 MED ORDER — LIDOCAINE HCL (CARDIAC) 20 MG/ML IV SOLN
INTRAVENOUS | Status: AC
  Filled 2014-07-13: qty 5

## 2014-07-13 MED ORDER — MIDAZOLAM HCL 2 MG/2ML IJ SOLN
INTRAMUSCULAR | Status: AC
  Filled 2014-07-13: qty 2

## 2014-07-13 MED ORDER — ONDANSETRON HCL 4 MG/2ML IJ SOLN
INTRAMUSCULAR | Status: DC | PRN
  Administered 2014-07-13: 4 mg via INTRAVENOUS

## 2014-07-13 MED ORDER — PROMETHAZINE HCL 25 MG/ML IJ SOLN: 6.2500 mg | INTRAMUSCULAR | Status: DC | PRN

## 2014-07-13 MED ORDER — SODIUM CHLORIDE 0.9 % IJ SOLN
INTRAMUSCULAR | Status: AC
  Filled 2014-07-13: qty 50

## 2014-07-13 MED ORDER — KETOROLAC TROMETHAMINE 30 MG/ML IJ SOLN: 30.0000 mg | Freq: Four times a day (QID) | INTRAMUSCULAR | Status: DC

## 2014-07-13 MED ORDER — VASOPRESSIN 20 UNIT/ML IV SOLN
INTRAVENOUS | Status: DC | PRN
  Administered 2014-07-13: 10 mL via INTRAMUSCULAR

## 2014-07-13 MED ORDER — HYDROMORPHONE HCL 1 MG/ML IJ SOLN
INTRAMUSCULAR | Status: AC
  Filled 2014-07-13: qty 1

## 2014-07-13 MED ORDER — DEXAMETHASONE SODIUM PHOSPHATE 4 MG/ML IJ SOLN
INTRAMUSCULAR | Status: AC
  Filled 2014-07-13: qty 1

## 2014-07-13 MED ORDER — KETOROLAC TROMETHAMINE 30 MG/ML IJ SOLN
30.0000 mg | Freq: Four times a day (QID) | INTRAMUSCULAR | Status: DC
  Administered 2014-07-13 – 2014-07-14 (×3): 30 mg via INTRAVENOUS
  Filled 2014-07-13 (×3): qty 1

## 2014-07-13 MED ORDER — GLYCOPYRROLATE 0.2 MG/ML IJ SOLN
INTRAMUSCULAR | Status: AC
  Filled 2014-07-13: qty 2

## 2014-07-13 MED ORDER — FENTANYL CITRATE (PF) 100 MCG/2ML IJ SOLN
INTRAMUSCULAR | Status: DC | PRN
  Administered 2014-07-13 (×3): 50 ug via INTRAVENOUS
  Administered 2014-07-13: 100 ug via INTRAVENOUS
  Administered 2014-07-13: 50 ug via INTRAVENOUS

## 2014-07-13 MED ORDER — FENTANYL CITRATE (PF) 250 MCG/5ML IJ SOLN
INTRAMUSCULAR | Status: AC
  Filled 2014-07-13: qty 5

## 2014-07-13 MED ORDER — SCOPOLAMINE 1 MG/3DAYS TD PT72
1.0000 | MEDICATED_PATCH | Freq: Once | TRANSDERMAL | Status: DC
  Administered 2014-07-13: 1.5 mg via TRANSDERMAL

## 2014-07-13 MED ORDER — KETOROLAC TROMETHAMINE 30 MG/ML IJ SOLN
INTRAMUSCULAR | Status: DC | PRN
  Administered 2014-07-13: 30 mg via INTRAVENOUS

## 2014-07-13 MED ORDER — CEFAZOLIN SODIUM-DEXTROSE 2-3 GM-% IV SOLR
2.0000 g | INTRAVENOUS | Status: AC
Start: 2014-07-13 — End: 2014-07-13
  Administered 2014-07-13: 2 g via INTRAVENOUS

## 2014-07-13 MED ORDER — MIDAZOLAM HCL 2 MG/2ML IJ SOLN
INTRAMUSCULAR | Status: DC | PRN
  Administered 2014-07-13: 2 mg via INTRAVENOUS

## 2014-07-13 MED ORDER — NEOSTIGMINE METHYLSULFATE 10 MG/10ML IV SOLN
INTRAVENOUS | Status: AC
  Filled 2014-07-13: qty 1

## 2014-07-13 MED ORDER — PANTOPRAZOLE SODIUM 40 MG PO TBEC
40.0000 mg | DELAYED_RELEASE_TABLET | Freq: Every day | ORAL | Status: DC
  Administered 2014-07-14: 40 mg via ORAL
  Filled 2014-07-13: qty 1

## 2014-07-13 MED ORDER — MEPERIDINE HCL 25 MG/ML IJ SOLN: 6.2500 mg | INTRAMUSCULAR | Status: DC | PRN

## 2014-07-13 MED ORDER — PROPOFOL 10 MG/ML IV BOLUS
INTRAVENOUS | Status: DC | PRN
  Administered 2014-07-13: 200 mg via INTRAVENOUS

## 2014-07-13 MED ORDER — PROPOFOL 10 MG/ML IV BOLUS
INTRAVENOUS | Status: AC
  Filled 2014-07-13: qty 20

## 2014-07-13 MED ORDER — OXYCODONE-ACETAMINOPHEN 5-325 MG PO TABS
1.0000 | ORAL_TABLET | ORAL | Status: DC | PRN
Start: 2014-07-13 — End: 2014-07-14
  Administered 2014-07-13 – 2014-07-14 (×2): 1 via ORAL
  Filled 2014-07-13 (×2): qty 1

## 2014-07-13 MED ORDER — NEOSTIGMINE METHYLSULFATE 10 MG/10ML IV SOLN
INTRAVENOUS | Status: DC | PRN
  Administered 2014-07-13: 4 mg via INTRAVENOUS

## 2014-07-13 MED ORDER — FENTANYL CITRATE (PF) 100 MCG/2ML IJ SOLN: 25.0000 ug | INTRAMUSCULAR | Status: DC | PRN

## 2014-07-13 MED ORDER — BISACODYL 5 MG PO TBEC
5.0000 mg | DELAYED_RELEASE_TABLET | Freq: Every day | ORAL | Status: DC | PRN
  Filled 2014-07-13: qty 1

## 2014-07-13 MED ORDER — HYDROMORPHONE HCL 1 MG/ML IJ SOLN
INTRAMUSCULAR | Status: DC | PRN
  Administered 2014-07-13 (×2): 1 mg via INTRAVENOUS

## 2014-07-13 MED ORDER — SCOPOLAMINE 1 MG/3DAYS TD PT72
MEDICATED_PATCH | TRANSDERMAL | Status: AC
  Administered 2014-07-13: 1.5 mg via TRANSDERMAL
  Filled 2014-07-13: qty 1

## 2014-07-13 MED ORDER — ONDANSETRON HCL 4 MG/2ML IJ SOLN
4.0000 mg | Freq: Four times a day (QID) | INTRAMUSCULAR | Status: DC | PRN
  Administered 2014-07-13: 4 mg via INTRAVENOUS
  Filled 2014-07-13: qty 2

## 2014-07-13 MED ORDER — GLYCOPYRROLATE 0.2 MG/ML IJ SOLN
INTRAMUSCULAR | Status: DC | PRN
  Administered 2014-07-13: .6 mg via INTRAVENOUS

## 2014-07-13 MED ORDER — DOCUSATE SODIUM 100 MG PO CAPS
100.0000 mg | ORAL_CAPSULE | Freq: Two times a day (BID) | ORAL | Status: DC
  Administered 2014-07-14: 100 mg via ORAL
  Filled 2014-07-13: qty 1

## 2014-07-13 MED ORDER — ROCURONIUM BROMIDE 100 MG/10ML IV SOLN
INTRAVENOUS | Status: DC | PRN
  Administered 2014-07-13: 10 mg via INTRAVENOUS
  Administered 2014-07-13: 50 mg via INTRAVENOUS

## 2014-07-13 MED ORDER — ROCURONIUM BROMIDE 100 MG/10ML IV SOLN
INTRAVENOUS | Status: AC
  Filled 2014-07-13: qty 1

## 2014-07-13 MED ORDER — ONDANSETRON HCL 4 MG PO TABS: 4.0000 mg | ORAL_TABLET | Freq: Four times a day (QID) | ORAL | Status: DC | PRN

## 2014-07-13 MED ORDER — DEXAMETHASONE SODIUM PHOSPHATE 10 MG/ML IJ SOLN
INTRAMUSCULAR | Status: DC | PRN
  Administered 2014-07-13: 4 mg via INTRAVENOUS

## 2014-07-13 MED ORDER — LACTATED RINGERS IV SOLN
INTRAVENOUS | Status: DC
Start: 2014-07-13 — End: 2014-07-13

## 2014-07-13 MED ORDER — KETOROLAC TROMETHAMINE 30 MG/ML IJ SOLN: 30.0000 mg | Freq: Once | INTRAMUSCULAR | Status: DC

## 2014-07-13 MED ORDER — ONDANSETRON HCL 4 MG/2ML IJ SOLN
INTRAMUSCULAR | Status: AC
  Filled 2014-07-13: qty 2

## 2014-07-13 MED ORDER — LACTATED RINGERS IV SOLN
INTRAVENOUS | Status: DC
  Administered 2014-07-13 (×3): via INTRAVENOUS

## 2014-07-13 MED ORDER — MENTHOL 3 MG MT LOZG: 1.0000 | LOZENGE | OROMUCOSAL | Status: DC | PRN

## 2014-07-13 MED ORDER — CEFAZOLIN SODIUM-DEXTROSE 2-3 GM-% IV SOLR
INTRAVENOUS | Status: AC
  Filled 2014-07-13: qty 50

## 2014-07-13 MED ORDER — LIDOCAINE HCL (CARDIAC) 20 MG/ML IV SOLN
INTRAVENOUS | Status: DC | PRN
  Administered 2014-07-13: 100 mg via INTRAVENOUS

## 2014-07-13 MED ORDER — SIMETHICONE 80 MG PO CHEW: 80.0000 mg | CHEWABLE_TABLET | Freq: Four times a day (QID) | ORAL | Status: DC | PRN

## 2014-07-13 MED ORDER — FENTANYL CITRATE (PF) 100 MCG/2ML IJ SOLN
INTRAMUSCULAR | Status: AC
  Filled 2014-07-13: qty 2

## 2014-07-13 MED ORDER — VASOPRESSIN 20 UNIT/ML IV SOLN
INTRAVENOUS | Status: AC
  Filled 2014-07-13: qty 1

## 2014-07-13 MED ORDER — LACTATED RINGERS IV SOLN
INTRAVENOUS | Status: DC
  Administered 2014-07-13 – 2014-07-14 (×2): via INTRAVENOUS

## 2014-07-13 SURGICAL SUPPLY — 47 items
APL SKNCLS STERI-STRIP NONHPOA (GAUZE/BANDAGES/DRESSINGS) ×2
APPLICATOR COTTON TIP 6IN STRL (MISCELLANEOUS) ×3 IMPLANT
BARRIER ADHS 3X4 INTERCEED (GAUZE/BANDAGES/DRESSINGS) ×1 IMPLANT
BENZOIN TINCTURE PRP APPL 2/3 (GAUZE/BANDAGES/DRESSINGS) ×1 IMPLANT
BRR ADH 4X3 ABS CNTRL BYND (GAUZE/BANDAGES/DRESSINGS) ×2
CANISTER SUCT 3000ML (MISCELLANEOUS) ×3 IMPLANT
CLOTH BEACON ORANGE TIMEOUT ST (SAFETY) ×3 IMPLANT
CONT PATH 16OZ SNAP LID 3702 (MISCELLANEOUS) ×3 IMPLANT
DECANTER SPIKE VIAL GLASS SM (MISCELLANEOUS) ×1 IMPLANT
DRAPE WARM FLUID 44X44 (DRAPE) ×1 IMPLANT
DRSG OPSITE POSTOP 4X10 (GAUZE/BANDAGES/DRESSINGS) ×3 IMPLANT
DURAPREP 26ML APPLICATOR (WOUND CARE) ×3 IMPLANT
GAUZE SPONGE 4X4 16PLY XRAY LF (GAUZE/BANDAGES/DRESSINGS) ×4 IMPLANT
GLOVE BIOGEL M 6.5 STRL (GLOVE) ×6 IMPLANT
GLOVE BIOGEL PI IND STRL 6.5 (GLOVE) ×2 IMPLANT
GLOVE BIOGEL PI INDICATOR 6.5 (GLOVE) ×1
GLOVE ECLIPSE 6.5 STRL STRAW (GLOVE) ×9 IMPLANT
GOWN STRL REUS W/TWL LRG LVL3 (GOWN DISPOSABLE) ×9 IMPLANT
NEEDLE HYPO 22GX1.5 SAFETY (NEEDLE) IMPLANT
NS IRRIG 1000ML POUR BTL (IV SOLUTION) ×3 IMPLANT
PACK ABDOMINAL GYN (CUSTOM PROCEDURE TRAY) ×3 IMPLANT
PAD OB MATERNITY 4.3X12.25 (PERSONAL CARE ITEMS) ×3 IMPLANT
PROTECTOR NERVE ULNAR (MISCELLANEOUS) ×3 IMPLANT
SPONGE LAP 18X18 X RAY DECT (DISPOSABLE) ×6 IMPLANT
STAPLER VISISTAT 35W (STAPLE) IMPLANT
STRIP CLOSURE SKIN 1/2X4 (GAUZE/BANDAGES/DRESSINGS) ×1 IMPLANT
SUT CHROMIC 3 0 SH 27 (SUTURE) ×1 IMPLANT
SUT PLAIN 2 0 XLH (SUTURE) ×1 IMPLANT
SUT VIC AB 0 CT1 18XCR BRD8 (SUTURE) ×4 IMPLANT
SUT VIC AB 0 CT1 27 (SUTURE) ×3
SUT VIC AB 0 CT1 27XBRD ANBCTR (SUTURE) IMPLANT
SUT VIC AB 0 CT1 36 (SUTURE) ×3 IMPLANT
SUT VIC AB 0 CT1 8-18 (SUTURE) ×9
SUT VIC AB 0 CTB1 27 (SUTURE) ×6 IMPLANT
SUT VIC AB 2-0 CT1 (SUTURE) IMPLANT
SUT VIC AB 2-0 CT1 27 (SUTURE) ×6
SUT VIC AB 2-0 CT1 TAPERPNT 27 (SUTURE) ×4 IMPLANT
SUT VIC AB 2-0 SH 27 (SUTURE)
SUT VIC AB 2-0 SH 27XBRD (SUTURE) IMPLANT
SUT VIC AB 4-0 KS 27 (SUTURE) ×3 IMPLANT
SUT VICRYL 0 TIES 12 18 (SUTURE) ×3 IMPLANT
SYR 3ML 23GX1 SAFETY (SYRINGE) ×1 IMPLANT
SYR CONTROL 10ML LL (SYRINGE) IMPLANT
TOWEL OR 17X24 6PK STRL BLUE (TOWEL DISPOSABLE) ×6 IMPLANT
TRAY FOLEY CATH SILVER 14FR (SET/KITS/TRAYS/PACK) ×3 IMPLANT
WATER STERILE IRR 1000ML POUR (IV SOLUTION) ×3 IMPLANT
YANKAUER SUCT BULB TIP NO VENT (SUCTIONS) ×1 IMPLANT

## 2014-07-13 NOTE — Interval H&P Note (Signed)
History and Physical Interval Note:  07/13/2014 10:10 AM  Caroline Novak  has presented today for surgery, with the diagnosis of D25.9  Fibroids  The various methods of treatment have been discussed with the patient and family. After consideration of risks, benefits and other options for treatment, the patient has consented to  Procedure(s): HYSTERECTOMY ABDOMINAL (N/A) BILATERAL SALPINGECTOMY (Bilateral) as a surgical intervention .  The patient's history has been reviewed, patient examined, no change in status, stable for surgery.  I have reviewed the patient's chart and labs.  Questions were answered to the patient's satisfaction.     Janyth Pupa, M

## 2014-07-13 NOTE — Transfer of Care (Signed)
Immediate Anesthesia Transfer of Care Note  Patient: Caroline Novak  Procedure(s) Performed: Procedure(s): HYSTERECTOMY ABDOMINAL (N/A) BILATERAL SALPINGECTOMY (Bilateral) EXTENSIVE LYSIS OF ADHESION (N/A)  Patient Location: PACU  Anesthesia Type:General  Level of Consciousness: sedated  Airway & Oxygen Therapy: Patient Spontanous Breathing and Patient connected to nasal cannula oxygen  Post-op Assessment: Report given to RN and Post -op Vital signs reviewed and stable  Post vital signs: Reviewed and stable  Last Vitals:  Filed Vitals:   07/13/14 0903  BP: 150/85  Pulse: 65  Temp: 37.1 C  Resp: 18    Complications: No apparent anesthesia complications

## 2014-07-13 NOTE — Anesthesia Postprocedure Evaluation (Signed)
  Anesthesia Post-op Note  Patient: Caroline Novak  Procedure(s) Performed: Procedure(s): HYSTERECTOMY ABDOMINAL (N/A) BILATERAL SALPINGECTOMY (Bilateral) EXTENSIVE LYSIS OF ADHESION (N/A) Patient is awake and responsive. Pain and nausea are reasonably well controlled. Vital signs are stable and clinically acceptable. Oxygen saturation is clinically acceptable. There are no apparent anesthetic complications at this time. Patient is ready for discharge.

## 2014-07-13 NOTE — Op Note (Signed)
Preop Dx: Abnormal uterine bleeding, Uterine fibroids, Anemia Postop Dx: Same and Dense adhesions Procedure: Abdominal hysterectomy, bilateral salpingectomy, extensive lysis of adhesions Surgeon: Dr. Janyth Pupa Assistant: Dr. Christophe Louis Anesthesia:  general Complications: none EBL: 275cc Fluids: 2400cc UOP: 500cc  Findings: Enlarged 17wk sized uterus with multiple pedunculated fibroids as well as a large 6cm submucosal posterior fibroid and a 4cm anterior fibroid.  One of the fundal fibroids was noted to have dense adhesions to the bowel and pelvic side wall.  Normal ovaries and fallopian tubes bilaterally  Specimens: Uterus with cervix and bilateral fallopian tubes  Procedure:  The patient was taken to the operating room where a general anesthetic was given. A timeout was performed confirming the appropriate procedure. The patient's abdomen was prepped with DuraPrep. Her perineum and vagina were prepped with Betadine. A Foley catheter was placed in the bladder. The patient was sterilely draped. A low transverse incision was made in the lower abdomen and carried sharply through the subcutaneous tissue to the fascia.  The fascia was incised and extended laterally.  The inferior aspect of the fascia was grasped with Kocher clamps and the underlying rectus muscle was dissected off sharply.  In a similar fashion, the superior aspect of the fascia was elevated with Kocher clamps and the rectus muscle was dissected off.  Hemostasis was achieved with the bovie.  The rectus muscle was separated in the midline and the peritoneum was exposed.  The peritoneum was entered sharply and the incision was extended superiorly and inferiorly with the Mezenbaum scissors.  Attempts were made to elevated the uterus into the operative field; however, due to the dense adhesion the uterus was immobile. Intraabdominal surveillance revealed the findings as mentioned above.   The O'Conner-O'Sullivan retractor was placed,  the bowel packed away using moist sponges.  Attention was first turned to the adhesions between the uterine fibroid and bowel.  Blunt dissection was attempted; however due to the extent of adhesions, careful sharp dissection was performed to separate the uterine fibroid from the bowel.  Close inspection was performed to ensure there was no injury to the bowel. Once the bowel was freed from the uterus, the upper pedicles were clamped and the uterus was elevated out of the abdomen. The round ligament was identified on the right. The round ligament was suture ligated and cut and the incision was carried down to create the vesicouterine flap. The right fallopian tube was serially clamped, cut and suture ligated to the cornua.  A window was created in an avascular area of the posterior leaf.  The ovarian ligament was clamped, cut and doubly ligated.  An identical procedure was carried out on the opposite side. Due to the large size of the uterus and multiple pedunculated fibroids- the uterus was injected with a dilute solution of vasopressin and the pigtail retractor was used for better mobility of the uterus and improved visualization.  The bladder flap was developed anteriorly.  The uterine arteries were skeletonized, doubly clamped, cut and suture ligated.  The body of the uterus was then removed to allow for better visualization.  The cardinal ligaments and uterosacral ligaments were clamped, cut and suture ligated.  Curved heany clamps were placed at the angles of the vagina, the cervix was removed from the vaginal cuff with scissors.  The vaginal cuff was closed with a continuous-running suture.  The pelvis was irrigated.  The vaginal cuff was reinforced with figure of eight stitches. Hemostasis was confirmed.  The pelvis was irrigated. Hemostasis  was again confirmed and interceed was placed over the vaginal cuff and adnexa. The bowel was unpacked and attention was turned to the area of prior adhesions.  Slight  bleeding was noted and the area was reinforced with 2-0 chromic in a running fashion.  Hemostasis was noted and again the pelvis was irrigated.  All instruments and packs were removed from the abdominal cavity. The anterior peritoneum was reapproximated in the midline in a running fashion. The fascia was closed using a running suture. The subcutaneous layer was closed using 2-0 plain gut. The skin was reapproximated using 4-0 vicryl on a Keith needle.  Sponge, needle, and instrument counts were correct.  The patient tolerated the procedure well. She was awakened from her anesthetic without difficulty. She was transported to the recovery room in stable condition. She was noted to drain clear yellow urine. All specimens were sent to pathology. Dr. Landry Mellow was present due to the complex nature of this case and that no residents are available to our service.  Janyth Pupa, DO [phone] (pager) [phone] (office)

## 2014-07-13 NOTE — Brief Op Note (Signed)
07/13/2014  4:54 PM  PATIENT:  Caroline Novak  44 y.o. female  PRE-OPERATIVE DIAGNOSIS:  D25.9  Fibroids  POST-OPERATIVE DIAGNOSIS:  D25.9  Fibroids  PROCEDURE:  Procedure(s): HYSTERECTOMY ABDOMINAL (N/A) BILATERAL SALPINGECTOMY (Bilateral) EXTENSIVE LYSIS OF ADHESION (N/A)  SURGEON:  Surgeon(s) and Role:    * Janyth Pupa, DO - Primary    * Christophe Louis, MD - Assisting  ANESTHESIA:   general  EBL:  Total I/O In: 2600 [I.V.:2600] Out: 1000 [Urine:725; Blood:275]  BLOOD ADMINISTERED:none  DRAINS: Urinary Catheter (Foley)   LOCAL MEDICATIONS USED:  NONE  SPECIMEN:  Uterus with cervix and bilateral fallopian tubes  DISPOSITION OF SPECIMEN:  PATHOLOGY  COUNTS:  YES  TOURNIQUET:  * No tourniquets in log *  DICTATION: .Note written in EPIC  PLAN OF CARE: Admit to inpatient   PATIENT DISPOSITION:  PACU - hemodynamically stable.   Delay start of Pharmacological VTE agent (>24hrs) due to surgical blood loss or risk of bleeding: yes

## 2014-07-13 NOTE — Anesthesia Postprocedure Evaluation (Signed)
  Anesthesia Post-op Note  Patient: Caroline Novak  Procedure(s) Performed: Procedure(s): HYSTERECTOMY ABDOMINAL (N/A) BILATERAL SALPINGECTOMY (Bilateral) EXTENSIVE LYSIS OF ADHESION (N/A)  Patient Location: PACU  Anesthesia Type:General  Level of Consciousness: awake  Airway and Oxygen Therapy: Patient Spontanous Breathing and Patient connected to nasal cannula oxygen  Post-op Pain: mild  Post-op Assessment: Post-op Vital signs reviewed, Patient's Cardiovascular Status Stable, Respiratory Function Stable, Patent Airway and No signs of Nausea or vomiting              Post-op Vital Signs: Reviewed and stable  Last Vitals:  Filed Vitals:   07/13/14 1311  BP: 126/74  Pulse: 69  Temp: 36.9 C  Resp: 14    Complications: No apparent anesthesia complications

## 2014-07-13 NOTE — Anesthesia Procedure Notes (Signed)
Procedure Name: MAC Date/Time: 07/13/2014 10:30 AM Performed by: Elenore Paddy Pre-anesthesia Checklist: Patient identified, Emergency Drugs available, Timeout performed, Suction available and Patient being monitored Patient Re-evaluated:Patient Re-evaluated prior to inductionOxygen Delivery Method: Circle system utilized Preoxygenation: Pre-oxygenation with 100% oxygen Intubation Type: IV induction Ventilation: Mask ventilation without difficulty Laryngoscope Size: Mac and 3 Grade View: Grade I Tube type: Oral Tube size: 7.0 mm Number of attempts: 1 (Intubated by Dr. Tammi Klippel electively.) Placement Confirmation: ETT inserted through vocal cords under direct vision,  positive ETCO2 and breath sounds checked- equal and bilateral Secured at: 21 cm Tube secured with: Tape Dental Injury: Teeth and Oropharynx as per pre-operative assessment

## 2014-07-14 LAB — CBC
HCT: 30.6 % — ABNORMAL LOW (ref 36.0–46.0)
Hemoglobin: 9.6 g/dL — ABNORMAL LOW (ref 12.0–15.0)
MCH: 25.3 pg — ABNORMAL LOW (ref 26.0–34.0)
MCHC: 31.4 g/dL (ref 30.0–36.0)
MCV: 80.7 fL (ref 78.0–100.0)
Platelets: 217 10*3/uL (ref 150–400)
RBC: 3.79 MIL/uL — ABNORMAL LOW (ref 3.87–5.11)
RDW: 19.6 % — ABNORMAL HIGH (ref 11.5–15.5)
WBC: 9.3 10*3/uL (ref 4.0–10.5)

## 2014-07-14 LAB — BASIC METABOLIC PANEL
Anion gap: 4 — ABNORMAL LOW (ref 5–15)
BUN: 8 mg/dL (ref 6–20)
CO2: 27 mmol/L (ref 22–32)
Calcium: 7.9 mg/dL — ABNORMAL LOW (ref 8.9–10.3)
Chloride: 106 mmol/L (ref 101–111)
Creatinine, Ser: 0.87 mg/dL (ref 0.44–1.00)
GFR calc Af Amer: >60 mL/min (ref >60)
GFR calc non Af Amer: >60 mL/min (ref >60)
Glucose, Bld: 111 mg/dL — ABNORMAL HIGH (ref 65–99)
Potassium: 3.4 mmol/L — ABNORMAL LOW (ref 3.5–5.1)
Sodium: 137 mmol/L (ref 135–145)

## 2014-07-14 MED ORDER — IBUPROFEN 600 MG PO TABS: 600.0000 mg | ORAL_TABLET | Freq: Four times a day (QID) | ORAL | Status: DC | PRN

## 2014-07-14 MED ORDER — DOCUSATE SODIUM 100 MG PO CAPS: 100.0000 mg | ORAL_CAPSULE | Freq: Two times a day (BID) | ORAL | Status: DC

## 2014-07-14 MED ORDER — IBUPROFEN 600 MG PO TABS
600.0000 mg | ORAL_TABLET | Freq: Four times a day (QID) | ORAL | Status: DC | PRN
  Filled 2014-07-14: qty 1

## 2014-07-14 MED ORDER — OXYCODONE-ACETAMINOPHEN 5-325 MG PO TABS: 1.0000 | ORAL_TABLET | Freq: Four times a day (QID) | ORAL | Status: DC | PRN

## 2014-07-14 NOTE — Addendum Note (Signed)
Addendum  created 07/14/14 4961 by Garner Nash, CRNA   Modules edited: Notes Section   Notes Section:  File: [id number]

## 2014-07-14 NOTE — Anesthesia Postprocedure Evaluation (Signed)
  Anesthesia Post-op Note  Patient: Caroline Novak  Procedure(s) Performed: Procedure(s): HYSTERECTOMY ABDOMINAL (N/A) BILATERAL SALPINGECTOMY (Bilateral) EXTENSIVE LYSIS OF ADHESION (N/A)  Patient Location: [facility]  Anesthesia Type:General  Level of Consciousness: awake and oriented  Airway and Oxygen Therapy: Patient Spontanous Breathing  Post-op Pain: mild  Post-op Assessment: Post-op Vital signs reviewed, Patient's Cardiovascular Status Stable, Respiratory Function Stable, No signs of Nausea or vomiting, Pain level controlled and No headache              Post-op Vital Signs: Reviewed  Last Vitals:  Filed Vitals:   07/14/14 0606  BP: 132/83  Pulse: 65  Temp: 37.1 C  Resp: 16    Complications: No apparent anesthesia complications

## 2014-07-14 NOTE — Discharge Instructions (Signed)
Abdominal Hysterectomy, Care After Refer to this sheet in the next few weeks. These instructions provide you with information on caring for yourself after your procedure. Your health care provider may also give you more specific instructions. Your treatment has been planned according to current medical practices, but problems sometimes occur. Call your health care provider if you have any problems or questions after your procedure.  WHAT TO EXPECT AFTER THE PROCEDURE After your procedure, it is typical to have the following:  Pain.  Feeling tired.  Poor appetite.  Less interest in sex. HOME CARE INSTRUCTIONS  It takes 4-6 weeks to recover from this surgery. Make sure you follow all your health care provider's instructions. Home care instructions may include:  For pain management: You may alternate between Percocet and Motrin as needed for pain management.  The Percocet has Tylenol in it, so please do not take Tylenol over the counter.   Percocet may cause constipation, while taking this medication please take Colace (or any other stool softener) twice daily as needed.  You may also use any over the counter mild laxative as needed.    The honeycomb bandage may be removed within a few days while in the shower.  The steri strips are bandaids that may remain in place.  Should these start to become dirty or start falling off, you may remove them. You do not need to cover the incision.  Return to your health care provider to have your sutures taken out.  Take showers instead of baths for 2-3 weeks. Ask your health care provider when it is safe to start showering.  Do not douche, use tampons, or have sexual intercourse for at least 6 weeks or until your health care provider says you can.   No heavy lifting (nothing more than 10-15lbs) for the next 4-6 weeks.  You may do light activity/exercise only.  Get plenty of rest and sleep.   You can resume your normal diet  Eating foods high in  fiber may also help with constipation. Eat plenty of raw fruits and vegetables, whole grains, and beans.  Drink enough fluids to keep your urine clear or pale yellow.   Try to have someone at home with you for the first 1-2 weeks to help around the house.  Keep all follow-up appointments. SEEK MEDICAL CARE IF:   You have chills or fever.  You have swelling, redness, or pain in the area of your incision that is getting worse.   You have pus coming from the incision.   You notice a bad smell coming from the incision or bandage.   Your incision breaks open.   You feel dizzy or light-headed.   You have pain or bleeding when you urinate.   You have persistent diarrhea.   You have persistent nausea and vomiting.   You have abnormal vaginal discharge.   You have a rash.   You have any type of abnormal reaction or develop an allergy to your medicine.   Your pain medicine is not helping.  SEEK IMMEDIATE MEDICAL CARE IF:   You have a fever and your symptoms suddenly get worse.  You have severe abdominal pain.  You have chest pain.  You have shortness of breath.  You faint.  You have pain, swelling, or redness of your leg.  You have heavy vaginal bleeding with blood clots. MAKE SURE YOU:  Understand these instructions.  Will watch your condition.  Will get help right away if you are not doing  well or get worse. Document Released: 07/19/2004 Document Revised: 01/04/2013 Document Reviewed: 10/22/2012 [facility] Patient Information 2015 [place], [place]. This information is not intended to replace advice given to you by your health care provider. Make sure you discuss any questions you have with your health care provider.

## 2014-07-14 NOTE — Progress Notes (Addendum)
Postoperative Note Day # 1  S:  Patient resting comfortable in bed.  Pain controlled.  Tolerating general. No flatus, no BM.  Minimal bleeding.  Ambulating without difficulty.  Foley removed this am.  She denies n/v/f/c, SOB, or CP.  Doing well and reports no acute complaints.  O: Temp:  [98 F (36.7 C)-99 F (37.2 C)] 98.8 F (37.1 C) (07/01 0606) Pulse Rate:  [57-80] 65 (07/01 0606) Resp:  [14-25] 16 (07/01 0606) BP: (109-152)/(67-87) 132/83 mmHg (07/01 0606) SpO2:  [96 %-100 %] 97 % (07/01 0606) Weight:  [94.802 kg (209 lb)] 94.802 kg (209 lb) (06/30 1522)   Gen: A&Ox3, NAD CV: RRR, no MRG Resp: CTAB Abdomen: soft, appropriately tender, minimal distension, +BS Incision: c/d/i, bandage on Ext: No edema, no calf tenderness bilaterally, SCDs in place  Labs:  CBC Latest Ref Rng 07/14/2014 07/06/2014 08/19/2011  WBC 4.0 - 10.5 K/uL 9.3 5.6 -  Hemoglobin 12.0 - 15.0 g/dL 9.6(L) 11.0(L) 11.4(L)  Hematocrit 36.0 - 46.0 % 30.6(L) 34.8(L) -  Platelets 150 - 400 K/uL 217 298 -    A/P: Pt is a 44 y.o. G1P1 s/p TAH,BS, extensive lysis of adhesions, POD#1  - Pain well controlled, transition to oral pain management only today -GU: UOP is adequate, foley removed this am, pt to void on her own today -GI: Tolerating general diet -Activity: encouraged sitting up to chair and ambulation as tolerated -Prophylaxis: early ambulation, SCDs while in bed -Labs: stable as above -LR @ 125cc/hr, will decrease if good po intake -h/o HTN: BP medication held, BP stable as above, will restart as outpatient  DISPO:  Continue with routine postoperative care- pt to pass gas and increase walking today.  ADDENDUM @ 1700:  Pt doing very well.  Pain controlled, on po only.  Tolerating gen diet.  +Flatus, ambulating and voiding without problems.  Plan for discharge home today.   F/U in 2 weeks.

## 2014-07-14 NOTE — Progress Notes (Signed)
Pt verbalizes understanding of d/c instructions, medications, follow up appts, when to seek medical attention, wound care, and belongings policy. Pt was encouraged to check room thoroughly prior to leaving. IV was d/c without complications. Pt has no questions at this time. Pt was d/c via wheelchair to main entrance accompanied by myself and her family. Pt/family has copy of d/c instructions, prescription, and Recovering from [place]. Pts son is driving her home. Marry Guan

## 2014-07-17 ENCOUNTER — Encounter (HOSPITAL_COMMUNITY): Payer: Self-pay | Admitting: Obstetrics & Gynecology

## 2014-07-18 NOTE — Discharge Summary (Signed)
Physician Discharge Summary  Patient ID: Caroline Novak MRN: [id number] DOB/AGE: 46-Dec-1972 46 y.o.  Admit date: 07/13/2014 Discharge date: 07/14/14  Admission Diagnoses: Abnormal uterine bleeding, uterine leiomyomas, Anemia  Discharge Diagnoses:  same Discharged Condition: stable  Hospital Course: Caroline Novak is an 44 y.o. female. G1P1 who presents for abdominal hysterectomy and bilateral salpingectomy due to AUB and uterine leiomyomas. In review, the patient reports progressively heavier periods despite being on OCPs to help control her bleeding. During her menses, she will have two days of using both a super tampon & pad changing it every 1-2hours when at work (to avoid an accident). Denies intermenstrual bleeding. +Dysmenorrhea and sense of bloating/heaviness in her abdomen. Of note, the bleeding has led to anemia, with a recent Hgb of 9.9. Denies urinary or bowel complaints. No sexual dysfunction. Due to failed medical management, the patient has elected to proceed with surgical management.  Patient underwent a Total abdominal hysterectomy, bilateral salpingectomy and extensive lysis of adhesions on 07/13/14.  For information regarding the procedure, please see the operative note.  Her postoperative course was uncomplicated and she was discharged home in stable condition on POD#1.  Consults: None  Significant Diagnostic Studies: labs:  CBC Latest Ref Rng 07/14/2014 07/06/2014 08/19/2011  WBC 4.0 - 10.5 K/uL 9.3 5.6 -  Hemoglobin 12.0 - 15.0 g/dL 9.6(L) 11.0(L) 11.4(L)  Hematocrit 36.0 - 46.0 % 30.6(L) 34.8(L) -  Platelets 150 - 400 K/uL 217 298 -   Treatments: IV hydration, antibiotics: Ancef, analgesia: Toradol, Percocet, Motrin and surgery: Abdominal hysterectomy, bilateral salpingectomy, extensive lysis of adhesions  Discharge Exam: Blood pressure 137/83, pulse 84, temperature 98.2 F (36.8 C), temperature source Oral, resp. rate 18, height 5\' 4"  (1.626 m), weight 94.802 kg (209 lb),  SpO2 100 %. Gen: A&Ox3, NAD CV: RRR, no MRG Resp: CTAB Abdomen: soft, appropriately tender, minimal distension, +BS Incision: c/d/i, bandage on Ext: No edema, no calf tenderness bilaterally, SCDs in place  Disposition: 01-Home or Self Care     Medication List    TAKE these medications        Biotin 5000 MCG Caps  Take 1 capsule by mouth daily.     docusate sodium 100 MG capsule  Commonly known as:  COLACE  Take 1 capsule (100 mg total) by mouth 2 (two) times daily.     hydrochlorothiazide 12.5 MG capsule  Commonly known as:  MICROZIDE  Take 12.5 mg by mouth daily.     ibuprofen 600 MG tablet  Commonly known as:  ADVIL,MOTRIN  Take 1 tablet (600 mg total) by mouth every 6 (six) hours as needed for moderate pain.     Iron 325 (65 FE) MG Tabs  Take 1 tablet by mouth daily.     metoprolol succinate 50 MG 24 hr tablet  Commonly known as:  TOPROL-XL  Take 50 mg by mouth daily. Take with or immediately following a meal.     oxyCODONE-acetaminophen 5-325 MG per tablet  Commonly known as:  PERCOCET/ROXICET  Take 1-2 tablets by mouth every 6 (six) hours as needed (moderate to severe pain (when tolerating fluids)).     TRI-PREVIFEM 0.18/0.215/0.25 MG-35 MCG tablet  Generic drug:  Norgestimate-Ethinyl Estradiol Triphasic  Take 1 tablet by mouth Daily.           Follow-up Information    Follow up with Janyth Pupa, M, DO In 2 weeks.   Specialty:  Obstetrics and Gynecology   Contact information:   [address] [place] [address] [phone]       Signed: Annalee Genta 07/18/2014, 8:41 AM

## 2015-05-10 DIAGNOSIS — I10 Essential (primary) hypertension: Secondary | ICD-10-CM | POA: Diagnosis not present

## 2015-05-10 DIAGNOSIS — D649 Anemia, unspecified: Secondary | ICD-10-CM | POA: Diagnosis not present

## 2015-05-10 DIAGNOSIS — Z0000 Encounter for general adult medical examination without abnormal findings: Secondary | ICD-10-CM | POA: Diagnosis not present

## 2015-08-31 DIAGNOSIS — Z01419 Encounter for gynecological examination (general) (routine) without abnormal findings: Secondary | ICD-10-CM | POA: Diagnosis not present

## 2015-09-12 DIAGNOSIS — K080 Exfoliation of teeth due to systemic causes: Secondary | ICD-10-CM | POA: Diagnosis not present

## 2015-12-07 DIAGNOSIS — H6010 Cellulitis of external ear, unspecified ear: Secondary | ICD-10-CM | POA: Diagnosis not present

## 2015-12-11 ENCOUNTER — Encounter (HOSPITAL_COMMUNITY): Payer: Self-pay

## 2015-12-11 ENCOUNTER — Other Ambulatory Visit: Payer: Self-pay | Admitting: Family Medicine

## 2015-12-11 ENCOUNTER — Ambulatory Visit (HOSPITAL_COMMUNITY)
Admission: RE | Admit: 2015-12-11 | Discharge: 2015-12-11 | Disposition: A | Discharge status: Home / Self Care | Payer: Federal, State, Local not specified - PPO | Source: Ambulatory Visit | Attending: Family Medicine | Admitting: Family Medicine

## 2015-12-11 DIAGNOSIS — R29810 Facial weakness: Secondary | ICD-10-CM

## 2015-12-11 DIAGNOSIS — B028 Zoster with other complications: Secondary | ICD-10-CM | POA: Diagnosis not present

## 2015-12-11 DIAGNOSIS — R29818 Other symptoms and signs involving the nervous system: Secondary | ICD-10-CM | POA: Diagnosis not present

## 2015-12-11 DIAGNOSIS — H9202 Otalgia, left ear: Secondary | ICD-10-CM | POA: Diagnosis not present

## 2015-12-14 DIAGNOSIS — B0221 Postherpetic geniculate ganglionitis: Secondary | ICD-10-CM | POA: Diagnosis not present

## 2015-12-18 DIAGNOSIS — B028 Zoster with other complications: Secondary | ICD-10-CM | POA: Diagnosis not present

## 2015-12-18 DIAGNOSIS — B0229 Other postherpetic nervous system involvement: Secondary | ICD-10-CM | POA: Diagnosis not present

## 2015-12-18 DIAGNOSIS — B0221 Postherpetic geniculate ganglionitis: Secondary | ICD-10-CM | POA: Diagnosis not present

## 2016-01-04 DIAGNOSIS — B0221 Postherpetic geniculate ganglionitis: Secondary | ICD-10-CM | POA: Diagnosis not present

## 2016-05-09 ENCOUNTER — Other Ambulatory Visit: Payer: Self-pay | Admitting: Family Medicine

## 2016-05-09 DIAGNOSIS — Z1231 Encounter for screening mammogram for malignant neoplasm of breast: Secondary | ICD-10-CM

## 2016-05-14 DIAGNOSIS — I10 Essential (primary) hypertension: Secondary | ICD-10-CM | POA: Diagnosis not present

## 2016-05-14 DIAGNOSIS — Z23 Encounter for immunization: Secondary | ICD-10-CM | POA: Diagnosis not present

## 2016-05-14 DIAGNOSIS — Z01419 Encounter for gynecological examination (general) (routine) without abnormal findings: Secondary | ICD-10-CM | POA: Diagnosis not present

## 2016-05-30 ENCOUNTER — Ambulatory Visit
Admission: RE | Admit: 2016-05-30 | Discharge: 2016-05-30 | Disposition: A | Discharge status: Home / Self Care | Payer: Federal, State, Local not specified - PPO | Source: Ambulatory Visit | Attending: Family Medicine | Admitting: Family Medicine

## 2016-05-30 DIAGNOSIS — Z1231 Encounter for screening mammogram for malignant neoplasm of breast: Secondary | ICD-10-CM

## 2016-06-27 DIAGNOSIS — Z23 Encounter for immunization: Secondary | ICD-10-CM | POA: Diagnosis not present

## 2016-09-05 DIAGNOSIS — Z23 Encounter for immunization: Secondary | ICD-10-CM | POA: Diagnosis not present

## 2017-04-22 DIAGNOSIS — K080 Exfoliation of teeth due to systemic causes: Secondary | ICD-10-CM | POA: Diagnosis not present

## 2017-05-20 ENCOUNTER — Other Ambulatory Visit: Payer: Self-pay | Admitting: Family Medicine

## 2017-05-20 DIAGNOSIS — Z1231 Encounter for screening mammogram for malignant neoplasm of breast: Secondary | ICD-10-CM

## 2017-05-22 DIAGNOSIS — R002 Palpitations: Secondary | ICD-10-CM | POA: Diagnosis not present

## 2017-05-22 DIAGNOSIS — Z0000 Encounter for general adult medical examination without abnormal findings: Secondary | ICD-10-CM | POA: Diagnosis not present

## 2017-05-22 DIAGNOSIS — I10 Essential (primary) hypertension: Secondary | ICD-10-CM | POA: Diagnosis not present

## 2017-06-05 ENCOUNTER — Ambulatory Visit
Admission: RE | Admit: 2017-06-05 | Discharge: 2017-06-05 | Disposition: A | Discharge status: Home / Self Care | Payer: Federal, State, Local not specified - PPO | Source: Ambulatory Visit | Attending: Family Medicine | Admitting: Family Medicine

## 2017-06-05 DIAGNOSIS — Z1231 Encounter for screening mammogram for malignant neoplasm of breast: Secondary | ICD-10-CM

## 2017-08-19 ENCOUNTER — Encounter (HOSPITAL_COMMUNITY): Payer: Self-pay

## 2017-08-19 ENCOUNTER — Other Ambulatory Visit: Payer: Self-pay

## 2017-08-19 ENCOUNTER — Emergency Department (HOSPITAL_COMMUNITY): Payer: Federal, State, Local not specified - PPO

## 2017-08-19 ENCOUNTER — Emergency Department (HOSPITAL_COMMUNITY)
Admission: EM | Admit: 2017-08-19 | Discharge: 2017-08-20 | Disposition: A | Discharge status: Home / Self Care | Payer: Federal, State, Local not specified - PPO | Attending: Emergency Medicine | Admitting: Emergency Medicine

## 2017-08-19 DIAGNOSIS — I10 Essential (primary) hypertension: Secondary | ICD-10-CM | POA: Diagnosis not present

## 2017-08-19 DIAGNOSIS — B0221 Postherpetic geniculate ganglionitis: Secondary | ICD-10-CM | POA: Insufficient documentation

## 2017-08-19 DIAGNOSIS — R202 Paresthesia of skin: Secondary | ICD-10-CM | POA: Diagnosis not present

## 2017-08-19 DIAGNOSIS — Z79899 Other long term (current) drug therapy: Secondary | ICD-10-CM | POA: Diagnosis not present

## 2017-08-19 DIAGNOSIS — Z8619 Personal history of other infectious and parasitic diseases: Secondary | ICD-10-CM | POA: Diagnosis not present

## 2017-08-19 DIAGNOSIS — R200 Anesthesia of skin: Secondary | ICD-10-CM | POA: Diagnosis not present

## 2017-08-19 DIAGNOSIS — R011 Cardiac murmur, unspecified: Secondary | ICD-10-CM | POA: Insufficient documentation

## 2017-08-19 HISTORY — DX: Postherpetic geniculate ganglionitis: B02.21

## 2017-08-19 HISTORY — DX: Zoster without complications: B02.9

## 2017-08-19 LAB — CBC
HCT: 40.7 % (ref 36.0–46.0)
Hemoglobin: 12.8 g/dL (ref 12.0–15.0)
MCH: 27.7 pg (ref 26.0–34.0)
MCHC: 31.4 g/dL (ref 30.0–36.0)
MCV: 88.1 fL (ref 78.0–100.0)
Platelets: 221 10*3/uL (ref 150–400)
RBC: 4.62 MIL/uL (ref 3.87–5.11)
RDW: 13.5 % (ref 11.5–15.5)
WBC: 5.4 10*3/uL (ref 4.0–10.5)

## 2017-08-19 LAB — I-STAT CHEM 8, ED
BUN: 18 mg/dL (ref 6–20)
Calcium, Ion: 1.02 mmol/L — ABNORMAL LOW (ref 1.15–1.40)
Chloride: 103 mmol/L (ref 98–111)
Creatinine, Ser: 0.9 mg/dL (ref 0.44–1.00)
Glucose, Bld: 81 mg/dL (ref 70–99)
HCT: 41 % (ref 36.0–46.0)
Hemoglobin: 13.9 g/dL (ref 12.0–15.0)
Potassium: 3.6 mmol/L (ref 3.5–5.1)
Sodium: 140 mmol/L (ref 135–145)
TCO2: 27 mmol/L (ref 22–32)

## 2017-08-19 LAB — COMPREHENSIVE METABOLIC PANEL
ALT: 15 U/L (ref 0–44)
AST: 18 U/L (ref 15–41)
Albumin: 3.7 g/dL (ref 3.5–5.0)
Alkaline Phosphatase: 80 U/L (ref 38–126)
Anion gap: 10 (ref 5–15)
BUN: 15 mg/dL (ref 6–20)
CO2: 27 mmol/L (ref 22–32)
Calcium: 8.9 mg/dL (ref 8.9–10.3)
Chloride: 104 mmol/L (ref 98–111)
Creatinine, Ser: 0.97 mg/dL (ref 0.44–1.00)
GFR calc Af Amer: >60 mL/min (ref >60)
GFR calc non Af Amer: >60 mL/min (ref >60)
Glucose, Bld: 82 mg/dL (ref 70–99)
Potassium: 3.5 mmol/L (ref 3.5–5.1)
Sodium: 141 mmol/L (ref 135–145)
Total Bilirubin: 0.7 mg/dL (ref 0.3–1.2)
Total Protein: 7.3 g/dL (ref 6.5–8.1)

## 2017-08-19 LAB — DIFFERENTIAL
Abs Immature Granulocytes: 0 10*3/uL (ref 0.0–0.1)
Basophils Absolute: 0 10*3/uL (ref 0.0–0.1)
Basophils Relative: 1 %
Eosinophils Absolute: 0.1 10*3/uL (ref 0.0–0.7)
Eosinophils Relative: 2 %
Immature Granulocytes: 0 %
Lymphocytes Relative: 40 %
Lymphs Abs: 2.2 10*3/uL (ref 0.7–4.0)
Monocytes Absolute: 0.7 10*3/uL (ref 0.1–1.0)
Monocytes Relative: 12 %
Neutro Abs: 2.4 10*3/uL (ref 1.7–7.7)
Neutrophils Relative %: 45 %

## 2017-08-19 LAB — APTT: aPTT: 33 seconds (ref 24–36)

## 2017-08-19 LAB — PROTIME-INR
INR: 1
Prothrombin Time: 13.1 seconds (ref 11.4–15.2)

## 2017-08-19 LAB — I-STAT BETA HCG BLOOD, ED (MC, WL, AP ONLY): I-stat hCG, quantitative: <5 m[IU]/mL (ref <5)

## 2017-08-19 LAB — I-STAT TROPONIN, ED: Troponin i, poc: 0 ng/mL (ref 0.00–0.08)

## 2017-08-19 NOTE — ED Provider Notes (Signed)
[facility] EMERGENCY DEPARTMENT Provider Note   CSN: [id number] Arrival date & time: 08/19/17  1803     History   Chief Complaint Chief Complaint  Patient presents with  . Numbness    HPI Caroline Novak is a 47 y.o. female.  47 year old female with a history of hypertension and Ramsay Hunt syndrome presents to the emergency department for evaluation of paresthesias.  Paresthesias began at 8 AM today while at work.  Patient states that she felt paresthesias to the left side of her space extending to her jaw and forehead.  She had some tingling to her lips as well as an aching discomfort in her head which radiates from her central forehead around to her left parietal scalp and occiput.  She has had some intermittent paresthesias to the dorsal aspect of her left forearm into her first through third fingers.  Paresthesias to the upper extremity have spontaneously resolved for the past 2 hours.  She states that her facial paresthesias have slightly improved.  She notes that a lot of her symptoms feel consistent with her prior diagnosis of Ramsay Hunt syndrome.  She saw her primary care doctor who sent her to the emergency department for ischemic rule out.  She was given a course of antivirals to start if her evaluation today was reassuring.  She denies any recent fevers, vision changes, vision loss, tinnitus, hearing loss, difficulty speaking or swallowing, extremity weakness or absence of sensation, nausea, vomiting.  No hx of HLD, DM, TIA, CVA.  No medications taken PTA for symptoms.     Past Medical History:  Diagnosis Date  . Fibroadenoma of breast   . Heart murmur   . Hypertension   . Ramsay Hunt syndrome (geniculate herpes zoster)   . Shingles     Patient Active Problem List   Diagnosis Date Noted  . Abnormal uterine bleeding 07/13/2014    Past Surgical History:  Procedure Laterality Date  . ABDOMINAL HYSTERECTOMY N/A 07/13/2014   Procedure: HYSTERECTOMY  ABDOMINAL;  Surgeon: Janyth Pupa, DO;  Location: [facility];  Service: Gynecology;  Laterality: N/A;  . BILATERAL SALPINGECTOMY Bilateral 07/13/2014   Procedure: BILATERAL SALPINGECTOMY;  Surgeon: Janyth Pupa, DO;  Location: [facility];  Service: Gynecology;  Laterality: Bilateral;  . BREAST EXCISIONAL BIOPSY Left 2013  . BREAST SURGERY  08/19/2011   LT br mass removal  . ENDOMETRIAL CRYOABLATION    . LYSIS OF ADHESION N/A 07/13/2014   Procedure: EXTENSIVE LYSIS OF ADHESION;  Surgeon: Janyth Pupa, DO;  Location: [facility];  Service: Gynecology;  Laterality: N/A;  . NO PAST SURGERIES       OB History   None      Home Medications    Prior to Admission medications   Medication Sig Start Date End Date Taking? Authorizing Provider  hydrochlorothiazide (MICROZIDE) 12.5 MG capsule Take 12.5 mg by mouth daily.   Yes [provider]  metoprolol succinate (TOPROL-XL) 50 MG 24 hr tablet Take 50 mg by mouth daily. Take with or immediately following a meal.   Yes [provider]  docusate sodium (COLACE) 100 MG capsule Take 1 capsule (100 mg total) by mouth 2 (two) times daily. Patient not taking: Reported on 08/19/2017 07/14/14   Janyth Pupa, DO  ibuprofen (ADVIL,MOTRIN) 600 MG tablet Take 1 tablet (600 mg total) by mouth every 6 (six) hours as needed for moderate pain. Patient not taking: Reported on 08/19/2017 07/14/14   Janyth Pupa, DO  oxyCODONE-acetaminophen (PERCOCET/ROXICET) 5-325 MG  per tablet Take 1-2 tablets by mouth every 6 (six) hours as needed (moderate to severe pain (when tolerating fluids)). Patient not taking: Reported on 08/19/2017 07/14/14   Janyth Pupa, DO    Family History History reviewed. No pertinent family history.  Social History Social History   Tobacco Use  . Smoking status: Never Smoker  . Smokeless tobacco: Never Used  Substance Use Topics  . Alcohol use: No  . Drug use: No     Allergies   Patient has no known allergies.   Review of  Systems Review of Systems Ten systems reviewed and are negative for acute change, except as noted in the HPI.    Physical Exam Updated Vital Signs BP 123/87   Pulse (!) 58   Temp 98.4 F (36.9 C) (Oral)   Resp 16   LMP 06/29/2014 (Exact Date)   SpO2 98%   Physical Exam  Constitutional: She is oriented to person, place, and time. She appears well-developed and well-nourished. No distress.  Nontoxic appearing and in NAD  HENT:  Head: Normocephalic and atraumatic.  Right Ear: External ear normal.  Left Ear: Tympanic membrane, external ear and ear canal normal.  Mouth/Throat: Oropharynx is clear and moist.  Symmetric rise of the uvula with phonation  Eyes: Pupils are equal, round, and reactive to light. Conjunctivae and EOM are normal. No scleral icterus.  Neck: Normal range of motion.  Cardiovascular: Normal rate, regular rhythm and intact distal pulses.  Pulmonary/Chest: Effort normal. No stridor. No respiratory distress.  Respirations even and unlabored  Musculoskeletal: Normal range of motion.  Neurological: She is alert and oriented to person, place, and time. No cranial nerve deficit. She exhibits normal muscle tone. Coordination normal.  GCS 15. Speech is goal oriented. No cranial nerve deficits appreciated; symmetric eyebrow raise, residual L sided facial droop (baseline), tongue midline. Patient has equal grip strength bilaterally with 5/5 strength against resistance in all major muscle groups bilaterally. Sensation to light touch intact. Patient moves extremities without ataxia. Normal finger-nose-finger. Patient ambulatory with steady gait.  Skin: Skin is warm and dry. No rash noted. She is not diaphoretic. No erythema. No pallor.  Psychiatric: She has a normal mood and affect. Her behavior is normal.  Nursing note and vitals reviewed.    ED Treatments / Results  Labs (all labs ordered are listed, but only abnormal results are displayed) Labs Reviewed  I-STAT CHEM 8,  ED - Abnormal; Notable for the following components:      Result Value   Calcium, Ion 1.02 (*)    All other components within normal limits  PROTIME-INR  APTT  CBC  DIFFERENTIAL  COMPREHENSIVE METABOLIC PANEL  I-STAT TROPONIN, ED  I-STAT BETA HCG BLOOD, ED ([facility], [facility], [facility] ONLY)    EKG EKG Interpretation  Date/Time:  Wednesday August 19 2017 18:15:06 EDT Ventricular Rate:  73 PR Interval:  176 QRS Duration: 82 QT Interval:  392 QTC Calculation: 431 R Axis:   -40 Text Interpretation:  Normal sinus rhythm Left axis deviation Possible Anterior infarct , age undetermined Abnormal ECG No acute changes Confirmed by Varney Biles [phone]) on 08/19/2017 11:55:37 PM   Radiology Ct Head Wo Contrast  Result Date: 08/19/2017 CLINICAL DATA:  Left arm, face and hand numbness with tingling starting at 8 a.m. today. EXAM: CT HEAD WITHOUT CONTRAST TECHNIQUE: Contiguous axial images were obtained from the base of the skull through the vertex without intravenous contrast. COMPARISON:  12/11/2015 FINDINGS: BRAIN: The ventricles and sulci are normal. No  intraparenchymal hemorrhage, mass effect nor midline shift. No acute large vascular territory infarcts. Grey-white matter distinction is maintained. The basal ganglia are unremarkable. No abnormal extra-axial fluid collections. Basal cisterns are not effaced and midline. The brainstem and cerebellar hemispheres are without acute abnormalities. Extra-axial 9 x 5 x 6 mm calcification over the left frontal lobe may represent a small calcified meningioma without underlying edema or significant mass effect. Partially empty appearing pituitary sella. VASCULAR: Unremarkable. SKULL/SOFT TISSUES: No skull fracture. No significant soft tissue swelling. ORBITS/SINUSES: The included ocular globes and orbital contents are normal.The mastoid air cells are clear. The included paranasal sinuses are well-aerated. OTHER: None. IMPRESSION: No acute intracranial abnormality.  Electronically Signed   By: Ashley Royalty M.D.   On: 08/19/2017 19:11   Mr Brain Wo Contrast  Result Date: 08/20/2017 CLINICAL DATA:  Facial paresthesia. Left arm and hand numbness and tingling. History of Ramsay-Hunt syndrome. EXAM: MRI HEAD WITHOUT CONTRAST TECHNIQUE: Multiplanar, multiecho pulse sequences of the brain and surrounding structures were obtained without intravenous contrast. COMPARISON:  Head CT 08/19/2017 FINDINGS: BRAIN: There is no acute infarct, acute hemorrhage or mass effect. The midline structures are normal. There are no old infarcts. The white matter signal is normal for the patient's age. The CSF spaces are normal for age, with no hydrocephalus. Susceptibility-sensitive sequences show no chronic microhemorrhage or superficial siderosis. VASCULAR: Major intracranial arterial and venous sinus flow voids are preserved. SKULL AND UPPER CERVICAL SPINE: The visualized skull base, calvarium, upper cervical spine and extracranial soft tissues are normal. SINUSES/ORBITS: No fluid levels or advanced mucosal thickening. No mastoid or middle ear effusion. The orbits are normal. IMPRESSION: Normal brain MRI. Electronically Signed   By: Ulyses Jarred M.D.   On: 08/20/2017 01:14    Procedures Procedures (including critical care time)  Medications Ordered in ED Medications  valACYclovir (VALTREX) tablet 1,000 mg (1,000 mg Oral Given 08/20/17 0254)     10:28 PM Will consult with Neurology regarding symptoms. ABCD2 score is 3.  11:26 PM Case discussed with Dr. Lorraine Lax of neurology.  Advises MRI.  If negative, stable for discharge.  Patient updated on plan.  Agreeable to stay in the department for additional imaging.   Initial Impression / Assessment and Plan / ED Course  I have reviewed the triage vital signs and the nursing notes.  Pertinent labs & imaging results that were available during my care of the patient were reviewed by me and considered in my medical decision making (see chart  for details).     47 year old female presents to the emergency department for complaint of paresthesias to the left side of her face.  She has a history of Ramsay Hunt syndrome and states this feels similar.  She has no vesicles on exam and grossly normal, nonfocal neurologic exam today.  She has had reassuring CT scan and MRI of the brain.  Laboratory work-up noncontributory.  Case discussed with neurology who expresses comfort with discharge.  Patient previously seen by her primary care doctor and given prescription for valacyclovir which she can take as prescribed.  Return precautions discussed and provided. Patient discharged in stable condition with no unaddressed concerns.   Final Clinical Impressions(s) / ED Diagnoses   Final diagnoses:  Paresthesia    ED Discharge Orders    None       Antonietta Breach, PA-C 08/20/17 8921    Varney Biles, MD 08/23/17 1441

## 2017-08-19 NOTE — ED Triage Notes (Addendum)
Pt endorses left arm/face/hand numbness/tingling. 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 Axox4. Speech is clear, no weakness or visual changes. VSS. Episode began at 0800 this morning.

## 2017-08-20 ENCOUNTER — Emergency Department (HOSPITAL_COMMUNITY): Payer: Federal, State, Local not specified - PPO

## 2017-08-20 DIAGNOSIS — R202 Paresthesia of skin: Secondary | ICD-10-CM | POA: Diagnosis not present

## 2017-08-20 MED ORDER — VALACYCLOVIR HCL 500 MG PO TABS
1000.0000 mg | ORAL_TABLET | ORAL | Status: AC
  Administered 2017-08-20: 1000 mg via ORAL
  Filled 2017-08-20: qty 2

## 2017-08-20 NOTE — ED Notes (Signed)
Pt back from MRI 

## 2017-08-20 NOTE — Discharge Instructions (Signed)
Your MRI, CT scan, and blood work were reassuring. We advise that you continue your daily medications. Follow up with your primary care doctor in 1 week. You may return for new or concerning symptoms.

## 2017-08-20 NOTE — ED Notes (Signed)
Patient transported to MRI 

## 2017-12-28 DIAGNOSIS — K080 Exfoliation of teeth due to systemic causes: Secondary | ICD-10-CM | POA: Diagnosis not present

## 2018-01-14 DIAGNOSIS — K080 Exfoliation of teeth due to systemic causes: Secondary | ICD-10-CM | POA: Diagnosis not present

## 2018-06-06 IMAGING — MG DIGITAL SCREENING BILATERAL MAMMOGRAM WITH TOMO AND CAD
6 series · 6 of 22 positions shown · non-contrast
Comparison: Previous exam(s).

CLINICAL DATA: Screening.

EXAM:
DIGITAL SCREENING BILATERAL MAMMOGRAM WITH TOMO AND CAD

[L MLO synth-2D]
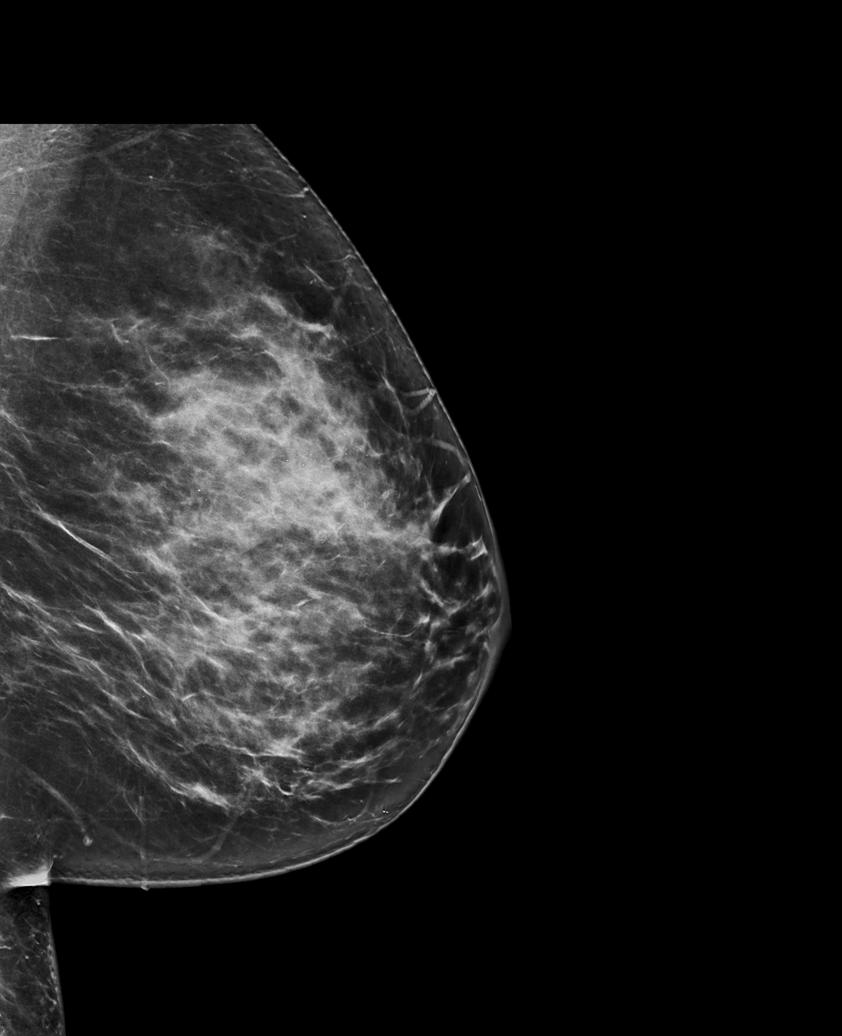

[R MLO synth-2D]
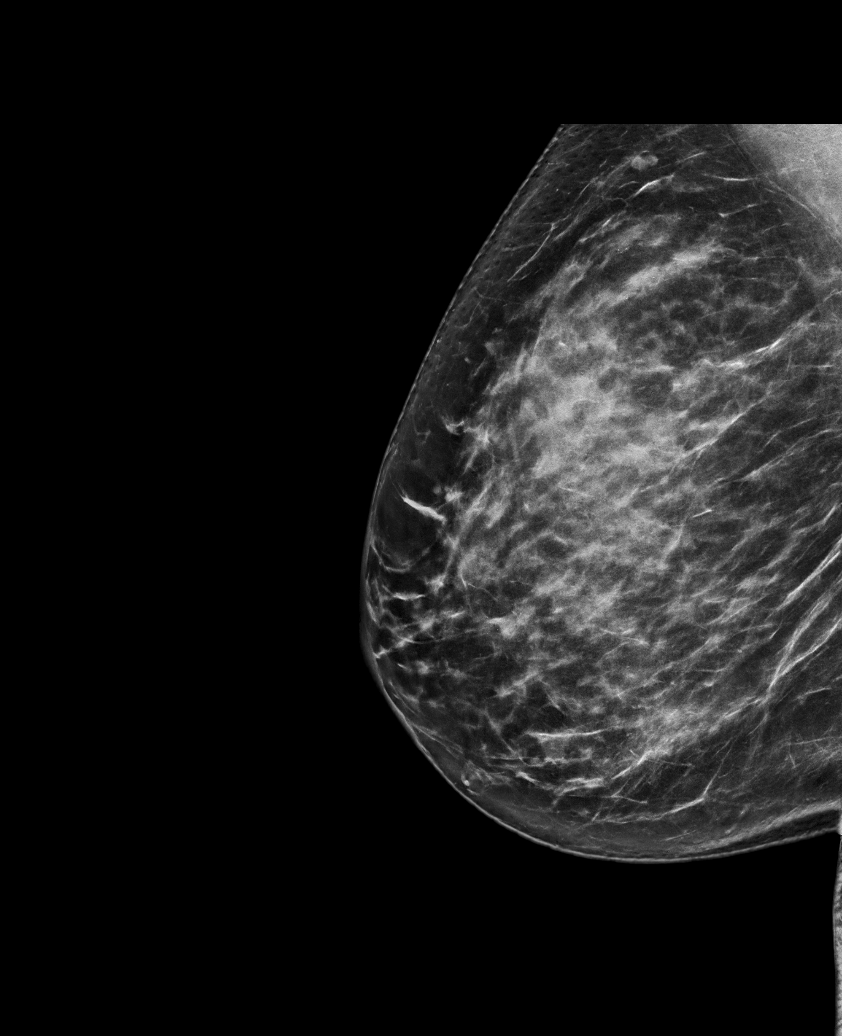

[L CC tomo · tomo slice 41/82.0]
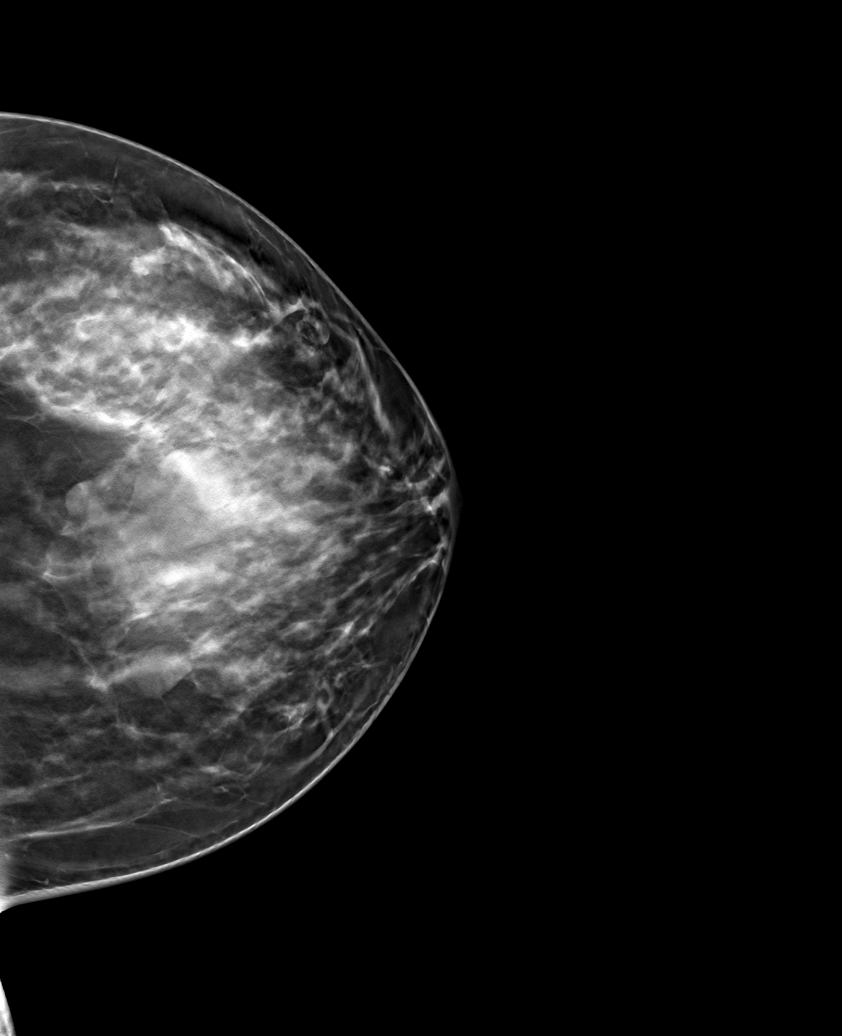

[R CC tomo · tomo slice 45/88.0]
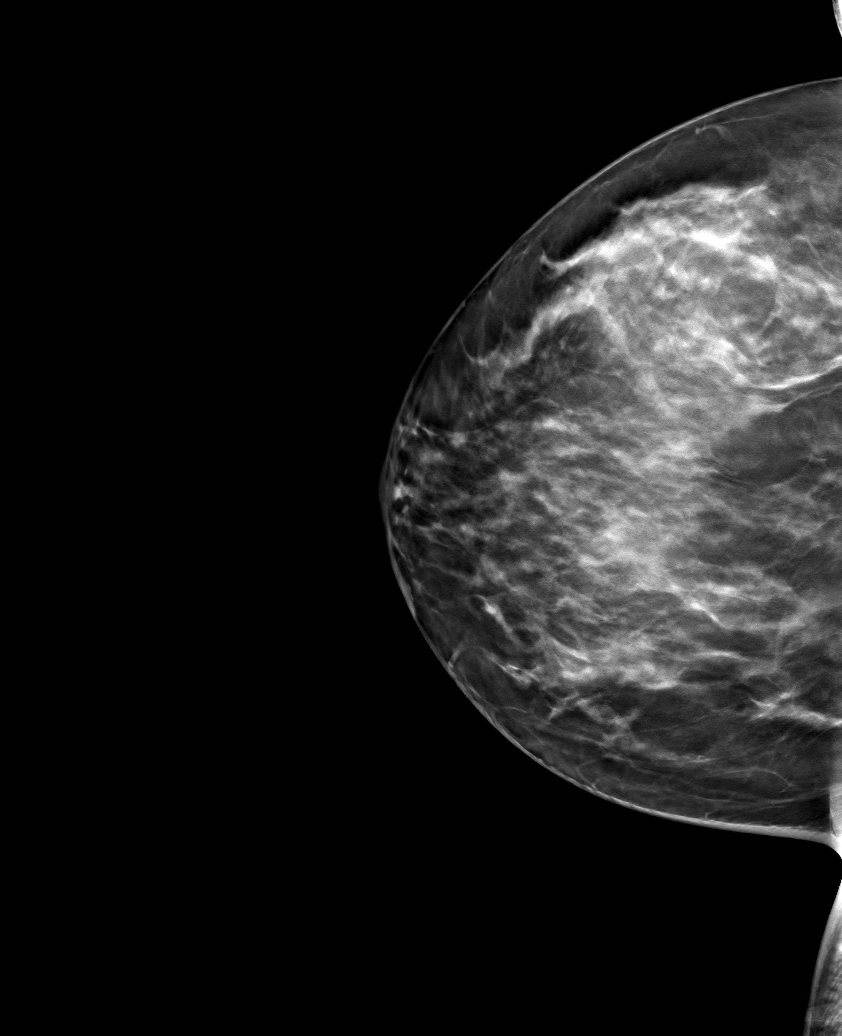

[R MLO tomo · tomo slice 43/86.0]
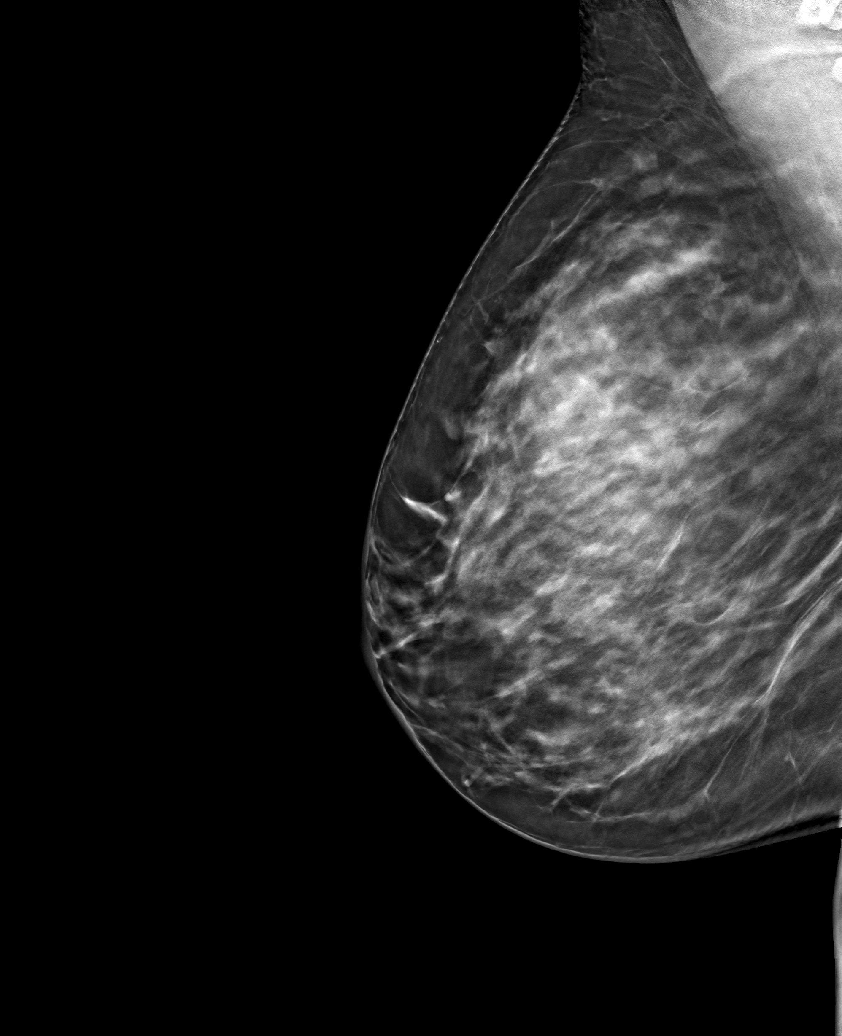

[L MLO tomo · tomo slice 43/85.0]
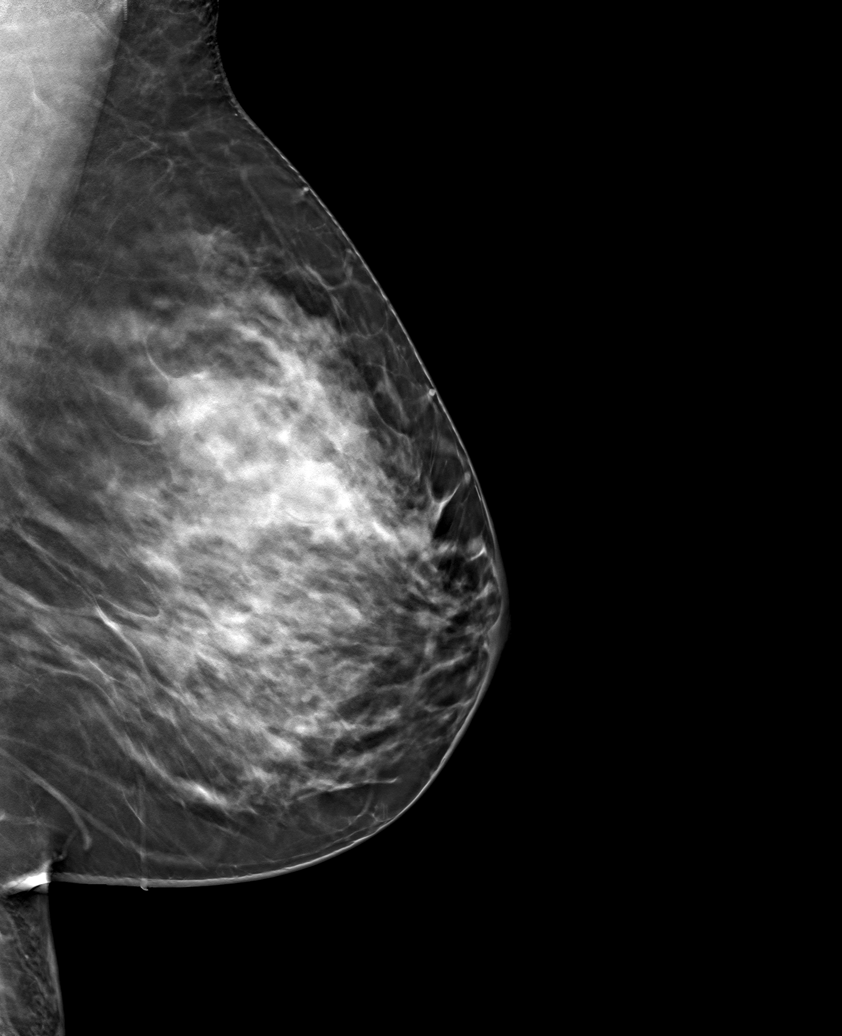

[6 of 22 positions shown; findings below may reference images not displayed]

ACR Breast Density Category d: The breast tissue is extremely dense,
which lowers the sensitivity of mammography.
FINDINGS: There are no findings suspicious for malignancy. Images were
processed with CAD.
IMPRESSION: No mammographic evidence of malignancy. A result letter of this
screening mammogram will be mailed directly to the patient.

RECOMMENDATION:
Screening mammogram in one year. (Code:RA-I-AVB)

BI-RADS CATEGORY  1: Negative.

## 2018-08-26 DIAGNOSIS — I10 Essential (primary) hypertension: Secondary | ICD-10-CM | POA: Diagnosis not present

## 2018-08-26 DIAGNOSIS — Z1211 Encounter for screening for malignant neoplasm of colon: Secondary | ICD-10-CM | POA: Diagnosis not present

## 2018-09-24 DIAGNOSIS — I10 Essential (primary) hypertension: Secondary | ICD-10-CM | POA: Diagnosis not present

## 2018-09-24 DIAGNOSIS — Z23 Encounter for immunization: Secondary | ICD-10-CM | POA: Diagnosis not present

## 2018-09-24 DIAGNOSIS — Z0000 Encounter for general adult medical examination without abnormal findings: Secondary | ICD-10-CM | POA: Diagnosis not present

## 2018-10-08 ENCOUNTER — Other Ambulatory Visit: Payer: Self-pay | Admitting: Family Medicine

## 2018-10-08 ENCOUNTER — Other Ambulatory Visit: Payer: Self-pay

## 2018-10-08 ENCOUNTER — Ambulatory Visit
Admission: RE | Admit: 2018-10-08 | Discharge: 2018-10-08 | Disposition: A | Discharge status: Home / Self Care | Payer: Federal, State, Local not specified - PPO | Source: Ambulatory Visit | Attending: Family Medicine | Admitting: Family Medicine

## 2018-10-08 DIAGNOSIS — Z1231 Encounter for screening mammogram for malignant neoplasm of breast: Secondary | ICD-10-CM

## 2019-02-10 DIAGNOSIS — M25561 Pain in right knee: Secondary | ICD-10-CM | POA: Diagnosis not present

## 2019-02-10 DIAGNOSIS — M25562 Pain in left knee: Secondary | ICD-10-CM | POA: Diagnosis not present

## 2019-02-10 DIAGNOSIS — M170 Bilateral primary osteoarthritis of knee: Secondary | ICD-10-CM | POA: Diagnosis not present

## 2019-02-22 DIAGNOSIS — M25561 Pain in right knee: Secondary | ICD-10-CM | POA: Diagnosis not present

## 2019-02-22 DIAGNOSIS — M25562 Pain in left knee: Secondary | ICD-10-CM | POA: Diagnosis not present

## 2019-03-04 DIAGNOSIS — I10 Essential (primary) hypertension: Secondary | ICD-10-CM | POA: Diagnosis not present

## 2019-03-04 DIAGNOSIS — M25561 Pain in right knee: Secondary | ICD-10-CM | POA: Diagnosis not present

## 2019-03-04 DIAGNOSIS — M25562 Pain in left knee: Secondary | ICD-10-CM | POA: Diagnosis not present

## 2019-03-18 DIAGNOSIS — M25562 Pain in left knee: Secondary | ICD-10-CM | POA: Diagnosis not present

## 2019-03-18 DIAGNOSIS — M25561 Pain in right knee: Secondary | ICD-10-CM | POA: Diagnosis not present

## 2019-03-24 DIAGNOSIS — M25561 Pain in right knee: Secondary | ICD-10-CM | POA: Diagnosis not present

## 2019-03-24 DIAGNOSIS — M1710 Unilateral primary osteoarthritis, unspecified knee: Secondary | ICD-10-CM | POA: Diagnosis not present

## 2019-03-24 DIAGNOSIS — M25562 Pain in left knee: Secondary | ICD-10-CM | POA: Diagnosis not present

## 2019-05-13 DIAGNOSIS — I10 Essential (primary) hypertension: Secondary | ICD-10-CM | POA: Diagnosis not present

## 2019-05-17 ENCOUNTER — Other Ambulatory Visit: Payer: Self-pay | Admitting: Surgery

## 2019-05-17 ENCOUNTER — Other Ambulatory Visit (HOSPITAL_COMMUNITY): Payer: Self-pay | Admitting: Surgery

## 2019-05-27 ENCOUNTER — Ambulatory Visit (HOSPITAL_COMMUNITY)
Admission: RE | Admit: 2019-05-27 | Discharge: 2019-05-27 | Disposition: A | Discharge status: Home / Self Care | Payer: Federal, State, Local not specified - PPO | Source: Ambulatory Visit | Attending: Surgery | Admitting: Surgery

## 2019-05-27 ENCOUNTER — Other Ambulatory Visit: Payer: Self-pay

## 2019-05-27 DIAGNOSIS — K219 Gastro-esophageal reflux disease without esophagitis: Secondary | ICD-10-CM | POA: Diagnosis not present

## 2019-06-07 ENCOUNTER — Other Ambulatory Visit: Payer: Self-pay

## 2019-06-07 ENCOUNTER — Encounter: Payer: Federal, State, Local not specified - PPO | Attending: Surgery | Admitting: Dietician

## 2019-06-07 ENCOUNTER — Encounter: Payer: Self-pay | Admitting: Dietician

## 2019-06-07 DIAGNOSIS — E669 Obesity, unspecified: Secondary | ICD-10-CM | POA: Insufficient documentation

## 2019-06-07 NOTE — Progress Notes (Signed)
Nutrition Assessment for Bariatric Surgery Medical Nutrition Therapy   Patient was seen on 06/07/2019 for Pre-Operative Nutrition Assessment. Letter of approval faxed to [facility] bariatric surgery program coordinator on 06/07/2019.   Referral stated Supervised Weight Loss (SWL) visits needed: 3  Planned surgery: Sleeve Gastrectomy  Pt expectation of surgery: to feel better, prevent future health problems   NUTRITION ASSESSMENT   Anthropometrics  Start weight at [facility]: 235.6 lbs (date: 06/07/2019) Height: 63.5 in BMI: 41.1 kg/m2     Lifestyle & Dietary Hx Typical meal pattern is 2-3 meals plus ~2 snacks per day. Meals may be salad, meatloaf, steak and shrimp, or pizza. Snacks may be chips. Drinks 3 bottles of plain water daily plus other beverages. Son lives with patient, and patient does the food shopping/cooking. May miss breakfast some days during the week, and may eat out ~2 meals per week.   24-Hr Dietary Recall First Meal: 4 strips bacon + 1 slice toast Snack: apples + peanut butter  Second Meal: pork chop + mac & cheese + green beans Snack: white cheddar popcorn  Third Meal: pizza Snack: - Beverages: water, Sparkling Ice, sometimes ginger  Estimated Energy Needs Calories: 1600 Carbohydrate: 180g Protein: 100g Fat: 53g   NUTRITION DIAGNOSIS  Overweight/obesity ([place]-3.3) related to past poor dietary habits and physical inactivity as evidenced by patient w/ planned Sleeve Gastrectomy surgery following dietary guidelines for continued weight loss.    NUTRITION INTERVENTION  Nutrition counseling (C-1) and education (E-2) to facilitate bariatric surgery goals.  Pre-Op Goals Reviewed with the Patient . Track food and beverage intake (pen and paper, MyFitness Pal, Baritastic app, etc.) . Make healthy food choices while monitoring portion sizes . Consume 3 meals per day or try to eat every 3-5 hours . Avoid concentrated sugars and fried foods . Keep sugar &  fat in the single digits per serving on food labels . Practice CHEWING your food (aim for applesauce consistency) . Practice not drinking 15 minutes before, during, and 30 minutes after each meal and snack . Avoid all carbonated beverages (ex: soda, sparkling beverages)  . Limit caffeinated beverages (ex: coffee, tea, energy drinks) . Avoid all sugar-sweetened beverages (ex: regular soda, sports drinks)  . Avoid alcohol  . Aim for 64-100 ounces of FLUID daily (with at least half of fluid intake being plain water)  . Aim for at least 60-80 grams of PROTEIN daily . Look for a liquid protein source that contains ?15 g protein and ?5 g carbohydrate (ex: shakes, drinks, shots) . Make a list of non-food related activities . Physical activity is an important part of a healthy lifestyle so keep it moving! The goal is to reach 150 minutes of exercise per week, including cardiovascular and weight baring activity.  *Goals that are bolded indicate the pt would like to start working towards these  Handouts Provided Include  . Bariatric Surgery handouts (Nutrition Visits, Pre-Op Goals, Protein Shakes, Vitamins & Minerals)  Learning Style & Readiness for Change Teaching method utilized: Visual & Auditory  Demonstrated degree of understanding via: Teach Back  Barriers to learning/adherence to lifestyle change: None Identified    MONITORING & EVALUATION Dietary intake, weekly physical activity, body weight, and pre-op goals reached at next nutrition visit.   Next Steps Patient is to return to [facility] in 1 month for 1st SWL Visit.

## 2019-06-07 NOTE — Patient Instructions (Signed)
Begin working through the [employer], starting with the following:  . Practice CHEWING your food (aim for applesauce consistency) . Practice not drinking 15 minutes before, during, and 30 minutes after each meal and snack

## 2019-07-04 ENCOUNTER — Other Ambulatory Visit: Payer: Self-pay

## 2019-07-04 ENCOUNTER — Encounter: Payer: Federal, State, Local not specified - PPO | Attending: Surgery | Admitting: Skilled Nursing Facility1

## 2019-07-04 DIAGNOSIS — E669 Obesity, unspecified: Secondary | ICD-10-CM

## 2019-07-04 NOTE — Progress Notes (Signed)
Supervised Weight Loss Visit Bariatric Nutrition Education  1 out of 3 SWL Appointments   [place] given previously: no  NUTRITION ASSESSMENT   eferral stated Supervised Weight Loss (SWL) visits needed: 3  Planned surgery: Sleeve Gastrectomy  Pt expectation of surgery: to feel better, prevent future health problems   NUTRITION ASSESSMENT   Anthropometrics  Start weight at [facility]: 235.6 lbs (date: 06/07/2019) Weight: 235 pounds  BMI: 40.98 kg/m2     Lifestyle & Dietary Hx  Son lives with patient, and patient does the food shopping/cooking.    24-Hr Dietary Recall First Meal: 4 strips bacon + 1 slice toast OR skipped or cheese grits + bacon or eggs and hot dogs Snack: apples + peanut butter or poppables Second Meal: pork chop + mac & cheese + green beans or 6in sub Snack: white cheddar popcorn  Third Meal: pizza Snack: - Beverages: water, Sparkling Ice, sometimes ginger  Estimated Energy Needs Calories: 1600 Carbohydrate: 180g Protein: 100g Fat: 53g   NUTRITION DIAGNOSIS  Overweight/obesity ([place]-3.3) related to past poor dietary habits and physical inactivity as evidenced by patient w/ planned sleeve surgery following dietary guidelines for continued weight loss.   NUTRITION INTERVENTION  Nutrition counseling (C-1) and education (E-2) to facilitate bariatric surgery goals.  Pre-Op Goals Progress & New Goals . Continue: not drinking with meals . Continue: chewing until applesauce consisently  . NEW: sit with each meal and understand the why; working on non hunger eating . NEW: stop sitting in negative self talk: try to think positively about yourself and say positive things about yourself . NEW: Work on being more active throughout the week  Handouts Provided Include     Learning Style & Readiness for Change Teaching method utilized: Visual & Auditory  Demonstrated degree of understanding via: Teach Back  Barriers to learning/adherence to lifestyle change: none  identified   RD's Notes for next Visit  . Assess pts adherence to chosen goals   MONITORING & EVALUATION Dietary intake, weekly physical activity, body weight, and pre-op goals in 1 month.   Next Steps  Patient is to return to [facility]

## 2019-07-22 ENCOUNTER — Ambulatory Visit (INDEPENDENT_AMBULATORY_CARE_PROVIDER_SITE_OTHER): Payer: Federal, State, Local not specified - PPO | Admitting: Psychology

## 2019-07-29 ENCOUNTER — Ambulatory Visit: Payer: Federal, State, Local not specified - PPO | Admitting: Psychology

## 2019-07-31 ENCOUNTER — Ambulatory Visit (INDEPENDENT_AMBULATORY_CARE_PROVIDER_SITE_OTHER): Payer: Federal, State, Local not specified - PPO | Admitting: Psychology

## 2019-08-02 ENCOUNTER — Other Ambulatory Visit: Payer: Self-pay

## 2019-08-02 ENCOUNTER — Encounter: Payer: Federal, State, Local not specified - PPO | Attending: Surgery | Admitting: Dietician

## 2019-08-02 ENCOUNTER — Encounter: Payer: Self-pay | Admitting: Dietician

## 2019-08-02 DIAGNOSIS — E669 Obesity, unspecified: Secondary | ICD-10-CM | POA: Insufficient documentation

## 2019-08-02 NOTE — Progress Notes (Signed)
Supervised Weight Loss Visit Bariatric Nutrition Education  2 out of 3 SWL Appointments    NUTRITION ASSESSMENT  Referral stated Supervised Weight Loss (SWL) visits needed: 3  Planned surgery: Sleeve Gastrectomy  Pt expectation of surgery: to feel better, prevent future health problems   NUTRITION ASSESSMENT   Anthropometrics  Start weight at [facility]: 235.6 lbs (date: 06/07/2019) Weight: 239 pounds  BMI: 41.7 kg/m2     Lifestyle & Dietary Hx Patient states she has been working on chewing her food well and this is getting better. Also working on not drinking with meals which is also improving. Incorporating more water and decreasing intake of carbonated beverages. States she is up to walking 2 days/week in the evenings when it cools off a bit outside. States she has been incorporating more vegetables in her diet. Typical meal pattern is 3 meals per day plus 1-2 snacks. States she finds her breakfast to be filling/satisfying, as well as when she has apples with peanut butter.   24-Hr Dietary Recall First Meal: 4 strips bacon + colby jack cheese (or Rustic Just Crack an Egg) (or eggs + hot dogs)  Snack: apples + peanut butter  Second Meal: [place] and ham sub + 1 individual bag chips (or salad w/ Ranch dressing)  Snack: fruit  Third Meal: steak + shrimp w/ garlic & herb butter + salad (or meatloaf + mac & cheese + green beans)  Snack: - Beverages: water, Sparkling Ice  Estimated Energy Needs Calories: 1600 Carbohydrate: 180g Protein: 100g Fat: 53g   NUTRITION DIAGNOSIS  Overweight/obesity ([place]-3.3) related to past poor dietary habits and physical inactivity as evidenced by patient w/ planned sleeve surgery following dietary guidelines for continued weight loss.   NUTRITION INTERVENTION  Nutrition counseling (C-1) and education (E-2) to facilitate bariatric surgery goals.  Pre-Op Goals Progress & New Goals . Continue: not drinking with meals . Continue: chewing until applesauce  consisently  . Continue: working on non hunger eating . Continue: being more active throughout the week  Learning Style & Readiness for Change Teaching method utilized: Visual & Auditory  Demonstrated degree of understanding via: Teach Back  Barriers to learning/adherence to lifestyle change: None Identified    MONITORING & EVALUATION Dietary intake, weekly physical activity, body weight, and pre-op goals in 1 month.   Next Steps  Patient is to return to [facility] in 1 month for 3rd SWL Visit.

## 2019-08-12 ENCOUNTER — Ambulatory Visit: Payer: Federal, State, Local not specified - PPO | Admitting: Psychology

## 2019-09-02 DIAGNOSIS — I10 Essential (primary) hypertension: Secondary | ICD-10-CM | POA: Diagnosis not present

## 2019-09-09 ENCOUNTER — Encounter: Payer: Self-pay | Admitting: Dietician

## 2019-09-09 ENCOUNTER — Encounter: Payer: Federal, State, Local not specified - PPO | Attending: Surgery | Admitting: Dietician

## 2019-09-09 ENCOUNTER — Other Ambulatory Visit: Payer: Self-pay

## 2019-09-09 DIAGNOSIS — E669 Obesity, unspecified: Secondary | ICD-10-CM | POA: Insufficient documentation

## 2019-09-09 NOTE — Progress Notes (Signed)
Supervised Weight Loss Visit Bariatric Nutrition Education  3 out of 3 SWL Appointments    NUTRITION ASSESSMENT  Referral stated Supervised Weight Loss (SWL) visits needed: 3  Planned surgery: Sleeve Gastrectomy  Pt expectation of surgery: to feel better, prevent future health problems   NUTRITION ASSESSMENT   Anthropometrics  Start weight at [facility]: 235.6 lbs (date: 06/07/2019) Weight: 235 lbs  BMI: 40.9 kg/m2     Lifestyle & Dietary Hx Patient states she has been trying [employer] meals for portion controlled options to have on hand. States she has been taking an appetite suppressant for 2 weeks through a program from which she ordered products from including protein powder. Drinks at least 3-4 bottles of water daily, sometimes will have 1 flavored with a zero-sugar flavor packet. Been working long 10-hour workdays, and states it is sometimes hard to fit all her meals and snacks in, but is aiming for 3 meals plus 1-2 snacks daily.   24-Hr Dietary Recall First Meal: 4 strips bacon + colby jack cheese (or Rustic Just Crack an Egg) (or eggs + hot dogs)  Snack: Skinny Pop individual bag Second Meal: [place] and ham sub + 1 individual bag chips (or salad w/ Ranch dressing)  Snack: grapes  Third Meal: steak + shrimp w/ garlic & herb butter + salad (or meatloaf + mac & cheese + green beans)  Snack: - Beverages: water, zero sugar flavored water  Estimated Energy Needs Calories: 1600 Carbohydrate: 180g Protein: 100g Fat: 53g   NUTRITION DIAGNOSIS  Overweight/obesity ([place]-3.3) related to past poor dietary habits and physical inactivity as evidenced by patient w/ planned sleeve surgery following dietary guidelines for continued weight loss.   NUTRITION INTERVENTION  Nutrition counseling (C-1) and education (E-2) to facilitate bariatric surgery goals.  Pre-Op Goals Progress & New Goals . Continue: not drinking with meals . Continue: chewing until applesauce consisently   . Continue: working on non hunger eating . Continue: being more active throughout the week . Continue: drinking at least 64 ounces of fluid daily . Continue: monitoring portion sizes  Learning Style & Readiness for Change Teaching method utilized: Visual & Auditory  Demonstrated degree of understanding via: Teach Back  Barriers to learning/adherence to lifestyle change: None Identified    MONITORING & EVALUATION Dietary intake, weekly physical activity, body weight, and pre-op goals at next nutrition visit.   Next Steps  Patient is to return to [facility] for Pre-Op Class ~2 weeks prior to surgery.

## 2019-10-07 DIAGNOSIS — Z23 Encounter for immunization: Secondary | ICD-10-CM | POA: Diagnosis not present

## 2019-10-07 DIAGNOSIS — I10 Essential (primary) hypertension: Secondary | ICD-10-CM | POA: Diagnosis not present

## 2019-10-07 DIAGNOSIS — Z0000 Encounter for general adult medical examination without abnormal findings: Secondary | ICD-10-CM | POA: Diagnosis not present

## 2019-10-07 DIAGNOSIS — Z13220 Encounter for screening for lipoid disorders: Secondary | ICD-10-CM | POA: Diagnosis not present

## 2019-10-09 IMAGING — MG MM DIGITAL SCREENING BILAT W/ TOMO W/ CAD
6 of 10 series · 6 of 30 positions shown · non-contrast
Comparison: Previous exam(s).

CLINICAL DATA: Screening.

EXAM:
DIGITAL SCREENING BILATERAL MAMMOGRAM WITH TOMO AND CAD

[L CC synth-2D (1 of 2)]
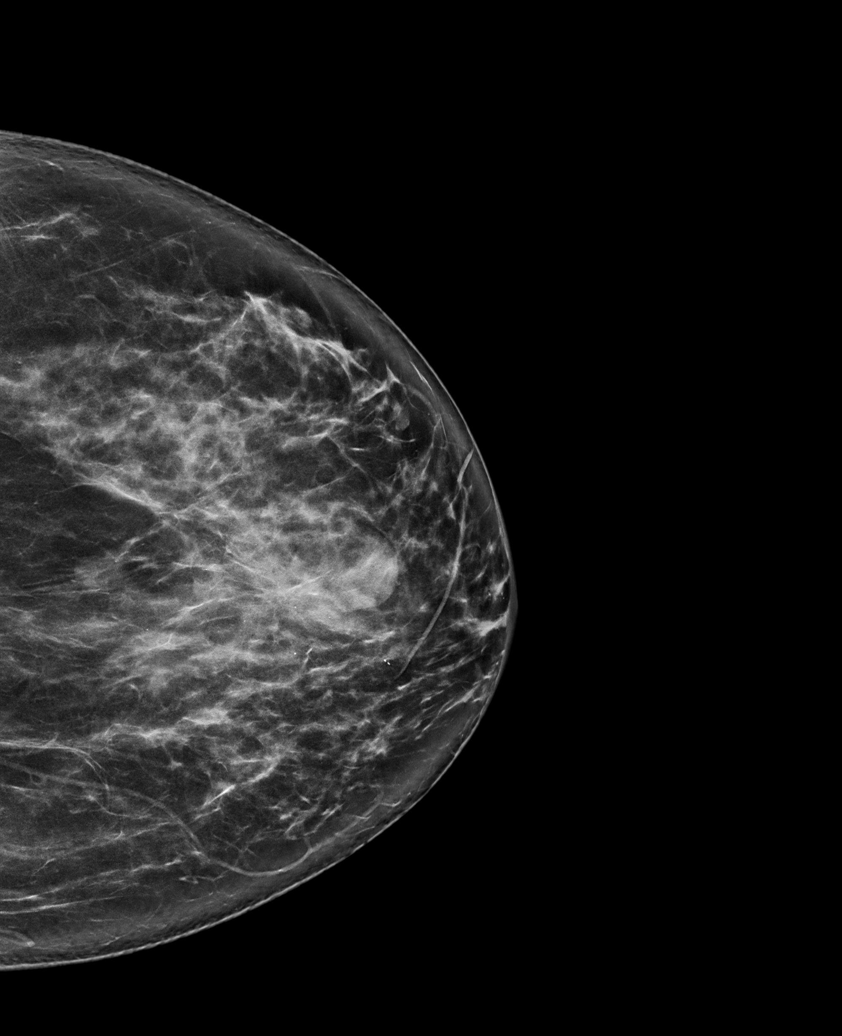

[R CC synth-2D]
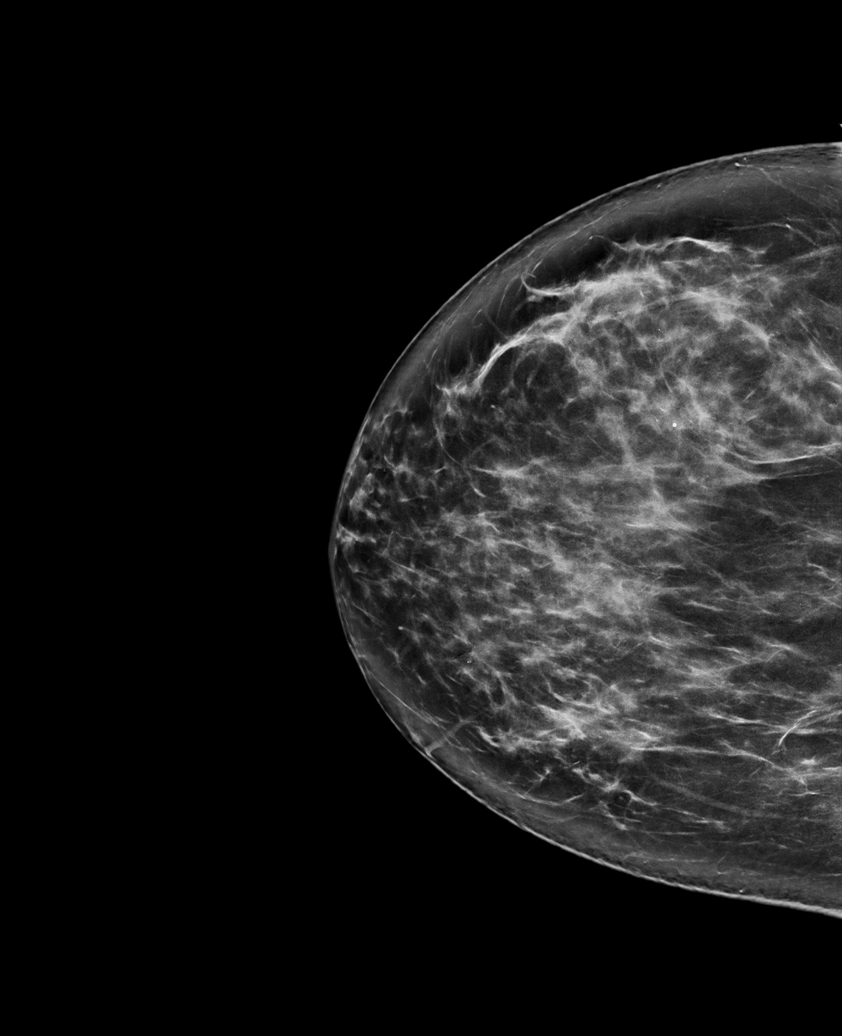

[R MLO synth-2D]
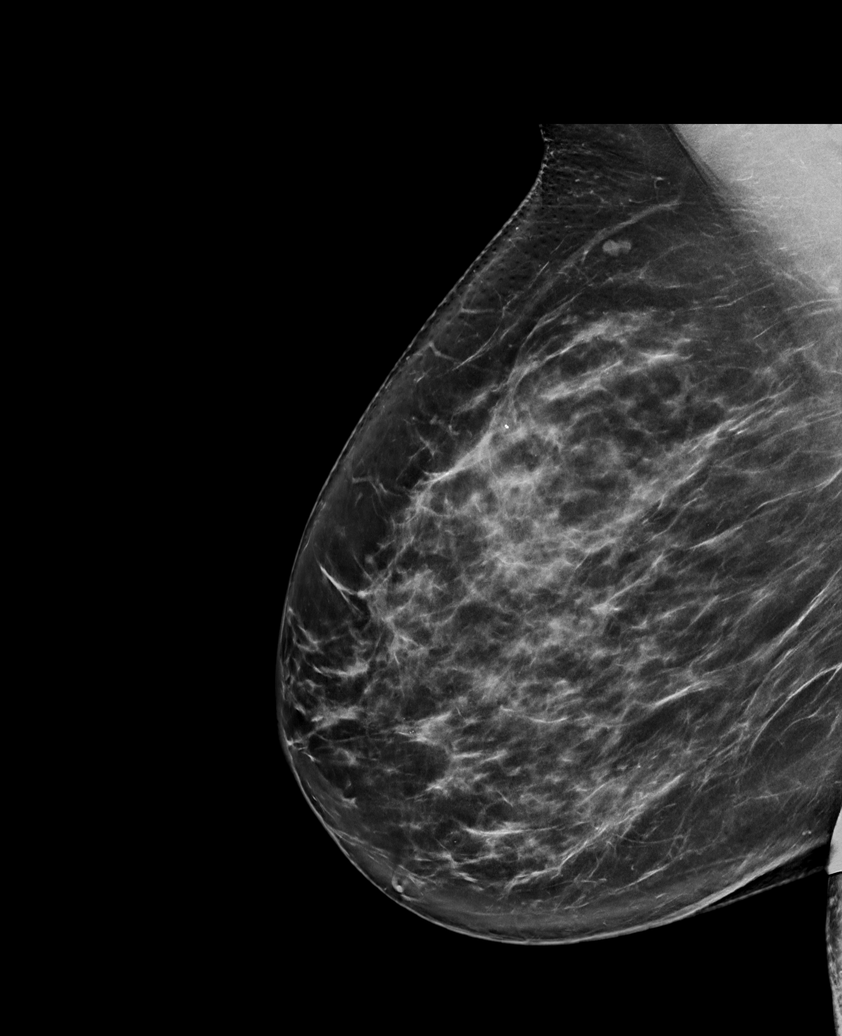

[L CC synth-2D (2 of 2)]
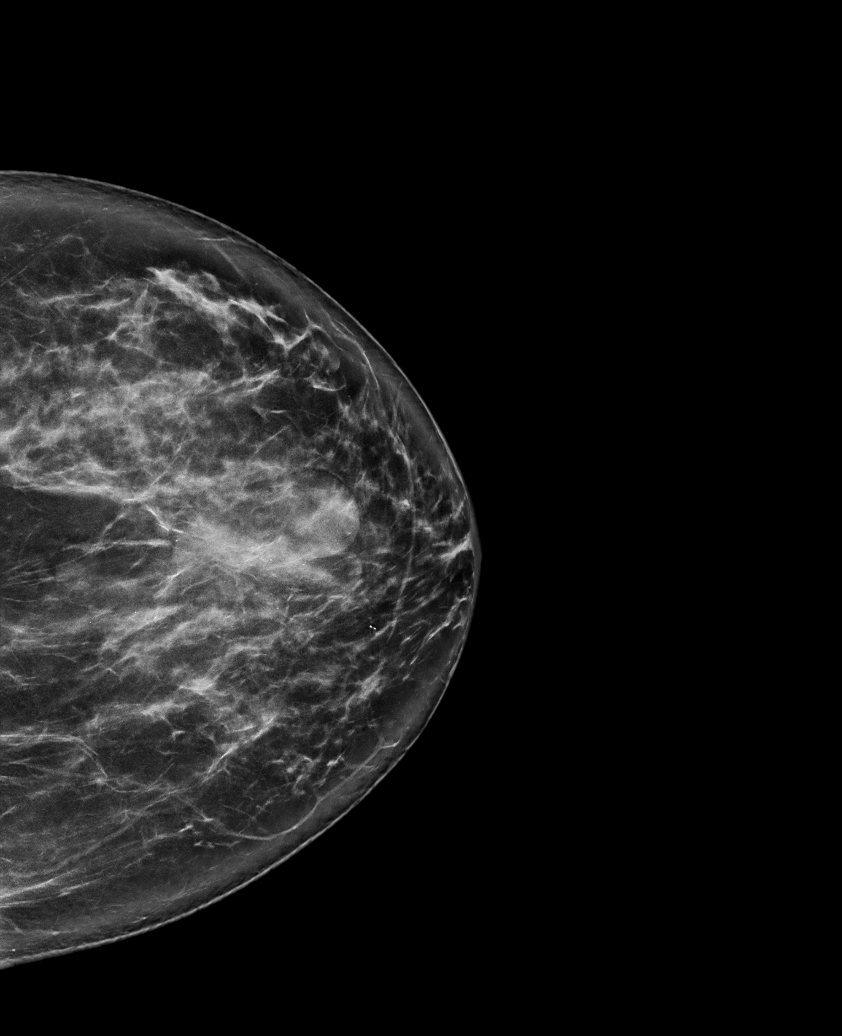

[L MLO synth-2D]
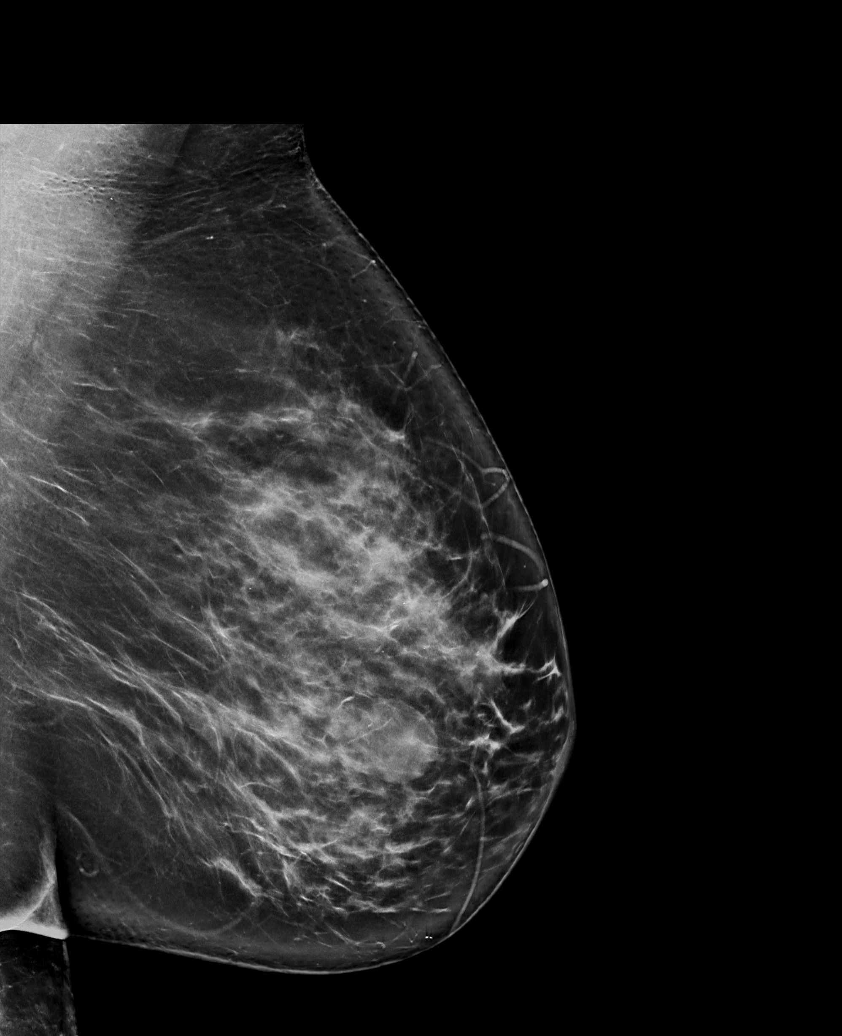

[L CC tomo · tomo slice 43/86.0]
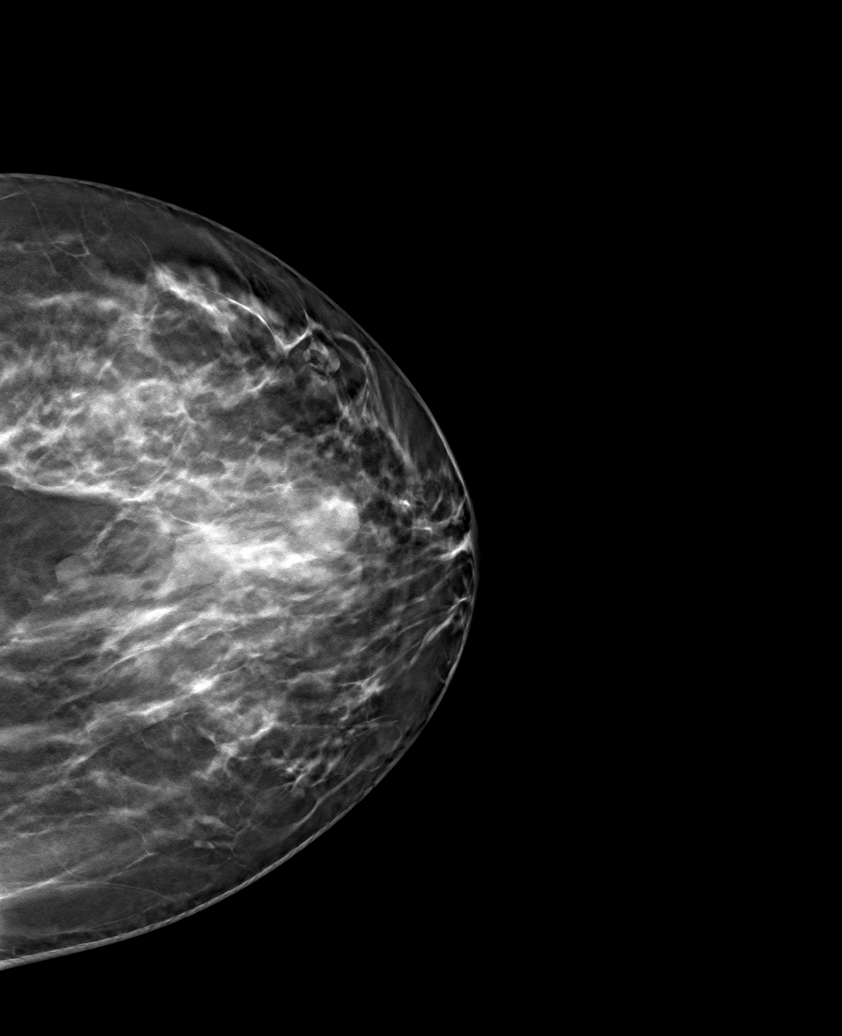

[6 of 30 positions shown; findings below may reference images not displayed]

ACR Breast Density Category c: The breast tissue is heterogeneously
dense, which may obscure small masses.
FINDINGS: There are no findings suspicious for malignancy. Images were
processed with CAD.
IMPRESSION: No mammographic evidence of malignancy. A result letter of this
screening mammogram will be mailed directly to the patient.

RECOMMENDATION:
Screening mammogram in one year. (Code:[id number])

BI-RADS CATEGORY  1: Negative.

## 2019-10-14 ENCOUNTER — Ambulatory Visit: Payer: Federal, State, Local not specified - PPO | Admitting: Dietician

## 2020-03-05 ENCOUNTER — Other Ambulatory Visit: Payer: Self-pay | Admitting: Family Medicine

## 2020-03-05 DIAGNOSIS — Z1231 Encounter for screening mammogram for malignant neoplasm of breast: Secondary | ICD-10-CM

## 2020-03-12 ENCOUNTER — Ambulatory Visit: Payer: Federal, State, Local not specified - PPO

## 2020-03-17 ENCOUNTER — Ambulatory Visit
Admission: RE | Admit: 2020-03-17 | Discharge: 2020-03-17 | Disposition: A | Discharge status: Home / Self Care | Payer: Federal, State, Local not specified - PPO | Source: Ambulatory Visit | Attending: Family Medicine | Admitting: Family Medicine

## 2020-03-17 ENCOUNTER — Other Ambulatory Visit: Payer: Self-pay

## 2020-03-17 DIAGNOSIS — Z1231 Encounter for screening mammogram for malignant neoplasm of breast: Secondary | ICD-10-CM | POA: Diagnosis not present

## 2020-04-25 DIAGNOSIS — I10 Essential (primary) hypertension: Secondary | ICD-10-CM | POA: Diagnosis not present

## 2020-06-08 DIAGNOSIS — E559 Vitamin D deficiency, unspecified: Secondary | ICD-10-CM | POA: Diagnosis not present

## 2020-06-08 DIAGNOSIS — N951 Menopausal and female climacteric states: Secondary | ICD-10-CM | POA: Diagnosis not present

## 2020-06-08 DIAGNOSIS — R635 Abnormal weight gain: Secondary | ICD-10-CM | POA: Diagnosis not present

## 2020-06-08 DIAGNOSIS — R5383 Other fatigue: Secondary | ICD-10-CM | POA: Diagnosis not present

## 2020-06-08 DIAGNOSIS — E7800 Pure hypercholesterolemia, unspecified: Secondary | ICD-10-CM | POA: Diagnosis not present

## 2020-06-18 DIAGNOSIS — Z1331 Encounter for screening for depression: Secondary | ICD-10-CM | POA: Diagnosis not present

## 2020-06-18 DIAGNOSIS — Z6841 Body Mass Index (BMI) 40.0 and over, adult: Secondary | ICD-10-CM | POA: Diagnosis not present

## 2020-06-18 DIAGNOSIS — E559 Vitamin D deficiency, unspecified: Secondary | ICD-10-CM | POA: Diagnosis not present

## 2020-06-18 DIAGNOSIS — E7800 Pure hypercholesterolemia, unspecified: Secondary | ICD-10-CM | POA: Diagnosis not present

## 2020-06-18 DIAGNOSIS — N951 Menopausal and female climacteric states: Secondary | ICD-10-CM | POA: Diagnosis not present

## 2020-06-18 DIAGNOSIS — Z1339 Encounter for screening examination for other mental health and behavioral disorders: Secondary | ICD-10-CM | POA: Diagnosis not present

## 2020-06-29 DIAGNOSIS — Z6841 Body Mass Index (BMI) 40.0 and over, adult: Secondary | ICD-10-CM | POA: Diagnosis not present

## 2020-06-29 DIAGNOSIS — E7800 Pure hypercholesterolemia, unspecified: Secondary | ICD-10-CM | POA: Diagnosis not present

## 2020-07-06 ENCOUNTER — Emergency Department (HOSPITAL_COMMUNITY): Payer: Federal, State, Local not specified - PPO

## 2020-07-06 ENCOUNTER — Other Ambulatory Visit: Payer: Self-pay

## 2020-07-06 ENCOUNTER — Emergency Department (HOSPITAL_COMMUNITY)
Admission: EM | Admit: 2020-07-06 | Discharge: 2020-07-06 | Disposition: A | Discharge status: Home / Self Care | Payer: Federal, State, Local not specified - PPO | Attending: Emergency Medicine | Admitting: Emergency Medicine

## 2020-07-06 ENCOUNTER — Encounter (HOSPITAL_COMMUNITY): Payer: Self-pay

## 2020-07-06 DIAGNOSIS — Z79899 Other long term (current) drug therapy: Secondary | ICD-10-CM | POA: Insufficient documentation

## 2020-07-06 DIAGNOSIS — G43819 Other migraine, intractable, without status migrainosus: Secondary | ICD-10-CM

## 2020-07-06 DIAGNOSIS — R519 Headache, unspecified: Secondary | ICD-10-CM | POA: Diagnosis not present

## 2020-07-06 DIAGNOSIS — G43809 Other migraine, not intractable, without status migrainosus: Secondary | ICD-10-CM | POA: Diagnosis not present

## 2020-07-06 DIAGNOSIS — I10 Essential (primary) hypertension: Secondary | ICD-10-CM | POA: Insufficient documentation

## 2020-07-06 LAB — CBC WITH DIFFERENTIAL/PLATELET
Abs Immature Granulocytes: 0.02 10*3/uL (ref 0.00–0.07)
Basophils Absolute: 0 10*3/uL (ref 0.0–0.1)
Basophils Relative: 1 %
Eosinophils Absolute: 0.1 10*3/uL (ref 0.0–0.5)
Eosinophils Relative: 2 %
HCT: 38 % (ref 36.0–46.0)
Hemoglobin: 12.4 g/dL (ref 12.0–15.0)
Immature Granulocytes: 0 %
Lymphocytes Relative: 37 %
Lymphs Abs: 2.3 10*3/uL (ref 0.7–4.0)
MCH: 28 pg (ref 26.0–34.0)
MCHC: 32.6 g/dL (ref 30.0–36.0)
MCV: 85.8 fL (ref 80.0–100.0)
Monocytes Absolute: 0.5 10*3/uL (ref 0.1–1.0)
Monocytes Relative: 9 %
Neutro Abs: 3.2 10*3/uL (ref 1.7–7.7)
Neutrophils Relative %: 51 %
Platelets: 250 10*3/uL (ref 150–400)
RBC: 4.43 MIL/uL (ref 3.87–5.11)
RDW: 14.4 % (ref 11.5–15.5)
WBC: 6.2 10*3/uL (ref 4.0–10.5)
nRBC: 0 % (ref 0.0–0.2)

## 2020-07-06 LAB — BASIC METABOLIC PANEL
Anion gap: 9 (ref 5–15)
BUN: 15 mg/dL (ref 6–20)
CO2: 25 mmol/L (ref 22–32)
Calcium: 9.2 mg/dL (ref 8.9–10.3)
Chloride: 104 mmol/L (ref 98–111)
Creatinine, Ser: 0.91 mg/dL (ref 0.44–1.00)
GFR, Estimated: >60 mL/min (ref >60)
Glucose, Bld: 93 mg/dL (ref 70–99)
Potassium: 3.3 mmol/L — ABNORMAL LOW (ref 3.5–5.1)
Sodium: 138 mmol/L (ref 135–145)

## 2020-07-06 MED ORDER — NAPROXEN 500 MG PO TABS: 500.0000 mg | ORAL_TABLET | Freq: Two times a day (BID) | ORAL | 0 refills | Status: DC

## 2020-07-06 MED ORDER — MAGNESIUM SULFATE IN D5W 1-5 GM/100ML-% IV SOLN
1.0000 g | Freq: Once | INTRAVENOUS | Status: AC
  Administered 2020-07-06: 1 g via INTRAVENOUS
  Filled 2020-07-06: qty 100

## 2020-07-06 MED ORDER — DIPHENHYDRAMINE HCL 50 MG/ML IJ SOLN
50.0000 mg | Freq: Once | INTRAMUSCULAR | Status: AC
  Administered 2020-07-06: 50 mg via INTRAVENOUS
  Filled 2020-07-06: qty 1

## 2020-07-06 MED ORDER — METOCLOPRAMIDE HCL 5 MG/ML IJ SOLN
10.0000 mg | Freq: Once | INTRAMUSCULAR | Status: AC
  Administered 2020-07-06: 10 mg via INTRAVENOUS
  Filled 2020-07-06: qty 2

## 2020-07-06 MED ORDER — DEXAMETHASONE SODIUM PHOSPHATE 4 MG/ML IJ SOLN
4.0000 mg | Freq: Once | INTRAMUSCULAR | Status: AC
  Administered 2020-07-06: 4 mg via INTRAVENOUS
  Filled 2020-07-06: qty 1

## 2020-07-06 MED ORDER — SODIUM CHLORIDE 0.9 % IV BOLUS
1000.0000 mL | Freq: Once | INTRAVENOUS | Status: AC
  Administered 2020-07-06: 1000 mL via INTRAVENOUS

## 2020-07-06 NOTE — ED Notes (Signed)
Pt needs to finish magnesium sulfate infusion before discharge. Approx 30 mins.

## 2020-07-06 NOTE — Discharge Instructions (Addendum)
You were seen today in the emergency department for headache.  Your work-up today was very reassuring.  There were no signs of bleed or trauma to your head.  I like you to schedule an appointment to follow-up with your primary care provider next week.  Although we are able to control the symptoms of your headache, further work-up may be required.  In the meantime please take naproxen twice daily for the next 5 days.  If conditions worsen or change please return back to emergency department for further evaluation.

## 2020-07-06 NOTE — ED Provider Notes (Signed)
Emergency Medicine Provider Triage Evaluation Note  Caroline Novak , a 50 y.o. female  was evaluated in triage.  Pt complains of left sided headache for the past week. Feels like a similar pattern as when she had shingles and then Ramsey-Hunt.   Review of Systems  Positive: Headache, left sided facial tingling Negative: N/V, neck pain, ear pain, facial droop  Physical Exam  BP (!) 166/99 (BP Location: Left Arm)   Pulse 84   Temp 98.1 F (36.7 C) (Oral)   Resp 16   LMP 06/29/2014 (Exact Date)   SpO2 98%  Gen:   Awake, no distress   Resp:  Normal effort  MSK:   Moves extremities without difficulty  Other:    Medical Decision Making  Medically screening exam initiated at 6:30 PM.  Appropriate orders placed.  Caroline Novak was informed that the remainder of the evaluation will be completed by another provider, this initial triage assessment does not replace that evaluation, and the importance of remaining in the ED until their evaluation is complete.     Lorayne Bender, PA-C 07/06/20 1832    Charlesetta Shanks, MD 07/11/20 [phone]

## 2020-07-06 NOTE — ED Provider Notes (Signed)
[facility] DEPT Provider Note   CSN: [id number] Arrival date & time: 07/06/20  1757     History Chief Complaint  Patient presents with   Migraine    Keneshia Tena is a 50 y.o. female.  HPI  Sweating with headache x1 week.  She does been intermittent until last night, when it woke her up from sleep.  Is been constant since then.  States it feels like something is crawling on the left side of her face.  Also feels like a burning sensation.  No associated nausea or vomiting or photophobia or phonophobia.  No recent head trauma, patient is not on blood thinners.  No history of blood clots.  She is not having any vision changes.  States this feels like when she had Ramsay Hunt a few years ago.  She does not have any new rashes, she is also vaccinated with Shingrix.  Past Medical History:  Diagnosis Date   Fibroadenoma of breast    Heart murmur    Hypertension    Ramsay Hunt syndrome (geniculate herpes zoster)    Shingles     Patient Active Problem List   Diagnosis Date Noted   Obesity 06/07/2019   Abnormal uterine bleeding 07/13/2014    Past Surgical History:  Procedure Laterality Date   ABDOMINAL HYSTERECTOMY N/A 07/13/2014   Procedure: HYSTERECTOMY ABDOMINAL;  Surgeon: Janyth Pupa, DO;  Location: [facility];  Service: Gynecology;  Laterality: N/A;   BILATERAL SALPINGECTOMY Bilateral 07/13/2014   Procedure: BILATERAL SALPINGECTOMY;  Surgeon: Janyth Pupa, DO;  Location: [facility];  Service: Gynecology;  Laterality: Bilateral;   BREAST EXCISIONAL BIOPSY Left 2013   BREAST SURGERY  08/19/2011   LT br mass removal   ENDOMETRIAL CRYOABLATION     LYSIS OF ADHESION N/A 07/13/2014   Procedure: EXTENSIVE LYSIS OF ADHESION;  Surgeon: Janyth Pupa, DO;  Location: [facility];  Service: Gynecology;  Laterality: N/A;   NO PAST SURGERIES       OB History   No obstetric history on file.     History reviewed. No pertinent family history.  Social History    Tobacco Use   Smoking status: Never   Smokeless tobacco: Never  Substance Use Topics   Alcohol use: No   Drug use: No    Home Medications Prior to Admission medications   Medication Sig Start Date End Date Taking? Authorizing Provider  docusate sodium (COLACE) 100 MG capsule Take 1 capsule (100 mg total) by mouth 2 (two) times daily. Patient not taking: Reported on 08/19/2017 07/14/14   Janyth Pupa, DO  hydrochlorothiazide (MICROZIDE) 12.5 MG capsule Take 12.5 mg by mouth daily.    [provider]  ibuprofen (ADVIL,MOTRIN) 600 MG tablet Take 1 tablet (600 mg total) by mouth every 6 (six) hours as needed for moderate pain. Patient not taking: Reported on 08/19/2017 07/14/14   Janyth Pupa, DO  metoprolol succinate (TOPROL-XL) 50 MG 24 hr tablet Take 50 mg by mouth daily. Take with or immediately following a meal.    [provider]  oxyCODONE-acetaminophen (PERCOCET/ROXICET) 5-325 MG per tablet Take 1-2 tablets by mouth every 6 (six) hours as needed (moderate to severe pain (when tolerating fluids)). Patient not taking: Reported on 08/19/2017 07/14/14   Janyth Pupa, DO    Allergies    Lisinopril  Review of Systems   Review of Systems  Constitutional:  Negative for chills, fatigue and fever.  HENT:  Negative for ear pain and sore throat.   Eyes:  Negative for pain and visual disturbance.  Respiratory:  Negative for cough and shortness of breath.   Cardiovascular:  Negative for chest pain and palpitations.  Gastrointestinal:  Negative for abdominal pain and vomiting.  Genitourinary:  Negative for dysuria and hematuria.  Musculoskeletal:  Negative for arthralgias and back pain.  Skin:  Negative for color change and rash.  Neurological:  Positive for headaches. Negative for seizures, syncope, weakness and numbness.  All other systems reviewed and are negative.  Physical Exam Updated Vital Signs BP 119/80 (BP Location: Right Arm)   Pulse 64   Temp 98 F (36.7 C)  (Oral)   Resp 16   LMP 06/29/2014 (Exact Date)   SpO2 100%   Physical Exam Vitals and nursing note reviewed. Exam conducted with a chaperone present.  Constitutional:      Appearance: Normal appearance.     Comments: Patient resting comfortably.  HENT:     Head: Normocephalic and atraumatic.  Eyes:     General: No scleral icterus.       Right eye: No discharge.        Left eye: No discharge.     Extraocular Movements: Extraocular movements intact.     Pupils: Pupils are equal, round, and reactive to light.  Cardiovascular:     Rate and Rhythm: Normal rate and regular rhythm.     Pulses: Normal pulses.     Heart sounds: Murmur heard.    No friction rub. No gallop.  Pulmonary:     Effort: Pulmonary effort is normal. No respiratory distress.     Breath sounds: Normal breath sounds.  Abdominal:     General: Abdomen is flat. Bowel sounds are normal. There is no distension.     Palpations: Abdomen is soft.     Tenderness: There is no abdominal tenderness.  Skin:    General: Skin is warm and dry.     Coloration: Skin is not jaundiced.     Comments: No rashes appreciated on exam.  Neurological:     General: No focal deficit present.     Mental Status: She is alert and oriented to person, place, and time. Mental status is at baseline.     Cranial Nerves: No cranial nerve deficit.     Sensory: No sensory deficit.     Motor: No weakness.     Coordination: Coordination normal.     Comments: Cranial nerves III through XII grossly intact.  There are some mild asymmetry to her lip.  She states that this is her normal ever since having Ramsay Hunt.  States her face does not look atypical for her.  No weakness on exam of the extremities.  Sensation light touch is grossly intact.  Grip strength is equal bilaterally.  Psychiatric:        Mood and Affect: Mood normal.    ED Results / Procedures / Treatments   Labs (all labs ordered are listed, but only abnormal results are  displayed) Labs Reviewed  BASIC METABOLIC PANEL - Abnormal; Notable for the following components:      Result Value   Potassium 3.3 (*)    All other components within normal limits  CBC WITH DIFFERENTIAL/PLATELET  CBC WITH DIFFERENTIAL/PLATELET    EKG None  Radiology No results found.  Procedures Procedures   Medications Ordered in ED Medications - No data to display  ED Course  I have reviewed the triage vital signs and the nursing notes.  Pertinent labs & imaging results that were  available during my care of the patient were reviewed by me and considered in my medical decision making (see chart for details).  Clinical Course as of 07/06/20 2240  Fri Jul 06, 2020  2130 Reports some improvement of pain but still having a mild HA. Will try decadron.  [HS]    Clinical Course User Index [HS] Sherrill Raring, PA-C   MDM Rules/Calculators/A&P                          Patient is a 50 year old female with history of Ramsay Hunt presenting with headache x1 week.  Headache worsened yesterday.  Her vitals are stable, her physical exam was very reassuring.  No focal deficits.  Because the headache woke her up acutely and she does not have a history of typical headaches, I have ordered a CT head to evaluate for [facility].    CT negative for [facility]. Her lab work is reassuring. I don't think this is Sherrell Puller hunt or shingles given lack of rash.   Vitals are reassuring, pain is improving with medicine.   Reevaluation; headache has resolved with a  trial of Decadron.  Vitals are stable, she is nontoxic-appearing I suspect that this is a typical headache.  We discussed work-up-told her we did not find any findings of SAH.  I do not believe this is Ramsay Hunt based on exam.  I advised patient to follow-up with her PCP next week to have a further evaluation of the headache symptoms. Do not suspect emergent pathology at this time based on exam and work-up thus far.  Return precautions given.  Patient is  agreeable to plan and voices understanding.  Patient is appropriate for discharge.  Final diagnoses:  None    Rx / DC Orders ED Discharge Orders     None        Sherrill Raring, [place] 07/06/20 2242    Little, Wenda Overland, MD 07/07/20 1459

## 2020-07-06 NOTE — ED Triage Notes (Signed)
Pt reports left sided migraine x2 week. Pt reports a similar pain when she had shingles a few years ago. Denies nausea and blurred vision.

## 2020-07-13 DIAGNOSIS — E559 Vitamin D deficiency, unspecified: Secondary | ICD-10-CM | POA: Diagnosis not present

## 2020-07-13 DIAGNOSIS — Z6841 Body Mass Index (BMI) 40.0 and over, adult: Secondary | ICD-10-CM | POA: Diagnosis not present

## 2020-10-16 DIAGNOSIS — Z131 Encounter for screening for diabetes mellitus: Secondary | ICD-10-CM | POA: Diagnosis not present

## 2020-10-16 DIAGNOSIS — Z23 Encounter for immunization: Secondary | ICD-10-CM | POA: Diagnosis not present

## 2020-10-16 DIAGNOSIS — Z124 Encounter for screening for malignant neoplasm of cervix: Secondary | ICD-10-CM | POA: Diagnosis not present

## 2020-10-16 DIAGNOSIS — Z0000 Encounter for general adult medical examination without abnormal findings: Secondary | ICD-10-CM | POA: Diagnosis not present

## 2020-10-16 DIAGNOSIS — Z13220 Encounter for screening for lipoid disorders: Secondary | ICD-10-CM | POA: Diagnosis not present

## 2020-12-14 DIAGNOSIS — D2339 Other benign neoplasm of skin of other parts of face: Secondary | ICD-10-CM | POA: Diagnosis not present

## 2020-12-14 DIAGNOSIS — L720 Epidermal cyst: Secondary | ICD-10-CM | POA: Diagnosis not present

## 2020-12-14 DIAGNOSIS — L723 Sebaceous cyst: Secondary | ICD-10-CM | POA: Diagnosis not present

## 2021-01-14 DIAGNOSIS — L728 Other follicular cysts of the skin and subcutaneous tissue: Secondary | ICD-10-CM | POA: Diagnosis not present

## 2021-01-14 DIAGNOSIS — L723 Sebaceous cyst: Secondary | ICD-10-CM | POA: Diagnosis not present

## 2021-01-14 DIAGNOSIS — R208 Other disturbances of skin sensation: Secondary | ICD-10-CM | POA: Diagnosis not present

## 2021-02-15 DIAGNOSIS — Z8371 Family history of colonic polyps: Secondary | ICD-10-CM | POA: Diagnosis not present

## 2021-02-15 DIAGNOSIS — K648 Other hemorrhoids: Secondary | ICD-10-CM | POA: Diagnosis not present

## 2021-02-15 DIAGNOSIS — Z1211 Encounter for screening for malignant neoplasm of colon: Secondary | ICD-10-CM | POA: Diagnosis not present

## 2021-02-15 DIAGNOSIS — D122 Benign neoplasm of ascending colon: Secondary | ICD-10-CM | POA: Diagnosis not present

## 2021-02-15 DIAGNOSIS — K644 Residual hemorrhoidal skin tags: Secondary | ICD-10-CM | POA: Diagnosis not present

## 2021-04-03 DIAGNOSIS — L728 Other follicular cysts of the skin and subcutaneous tissue: Secondary | ICD-10-CM | POA: Diagnosis not present

## 2021-04-03 DIAGNOSIS — R208 Other disturbances of skin sensation: Secondary | ICD-10-CM | POA: Diagnosis not present

## 2021-04-03 DIAGNOSIS — L723 Sebaceous cyst: Secondary | ICD-10-CM | POA: Diagnosis not present

## 2021-04-19 ENCOUNTER — Other Ambulatory Visit: Payer: Self-pay | Admitting: Family Medicine

## 2021-04-19 DIAGNOSIS — Z1231 Encounter for screening mammogram for malignant neoplasm of breast: Secondary | ICD-10-CM

## 2021-04-26 ENCOUNTER — Ambulatory Visit
Admission: RE | Admit: 2021-04-26 | Discharge: 2021-04-26 | Disposition: A | Discharge status: Home / Self Care | Payer: Federal, State, Local not specified - PPO | Source: Ambulatory Visit | Attending: Family Medicine | Admitting: Family Medicine

## 2021-04-26 DIAGNOSIS — Z1231 Encounter for screening mammogram for malignant neoplasm of breast: Secondary | ICD-10-CM | POA: Diagnosis not present

## 2021-04-29 ENCOUNTER — Other Ambulatory Visit: Payer: Self-pay | Admitting: Family Medicine

## 2021-04-29 DIAGNOSIS — R928 Other abnormal and inconclusive findings on diagnostic imaging of breast: Secondary | ICD-10-CM

## 2021-05-10 ENCOUNTER — Ambulatory Visit
Admission: RE | Admit: 2021-05-10 | Discharge: 2021-05-10 | Disposition: A | Discharge status: Home / Self Care | Payer: Federal, State, Local not specified - PPO | Source: Ambulatory Visit | Attending: Family Medicine | Admitting: Family Medicine

## 2021-05-10 DIAGNOSIS — N6012 Diffuse cystic mastopathy of left breast: Secondary | ICD-10-CM | POA: Diagnosis not present

## 2021-05-10 DIAGNOSIS — R928 Other abnormal and inconclusive findings on diagnostic imaging of breast: Secondary | ICD-10-CM

## 2021-08-21 DIAGNOSIS — G510 Bell's palsy: Secondary | ICD-10-CM | POA: Diagnosis not present

## 2021-08-21 DIAGNOSIS — G5133 Clonic hemifacial spasm, bilateral: Secondary | ICD-10-CM | POA: Diagnosis not present

## 2021-08-21 DIAGNOSIS — R471 Dysarthria and anarthria: Secondary | ICD-10-CM | POA: Diagnosis not present

## 2021-08-21 DIAGNOSIS — B0221 Postherpetic geniculate ganglionitis: Secondary | ICD-10-CM | POA: Diagnosis not present

## 2021-10-10 DIAGNOSIS — G5133 Clonic hemifacial spasm, bilateral: Secondary | ICD-10-CM | POA: Diagnosis not present

## 2021-10-10 DIAGNOSIS — G510 Bell's palsy: Secondary | ICD-10-CM | POA: Diagnosis not present

## 2021-10-21 DIAGNOSIS — Z23 Encounter for immunization: Secondary | ICD-10-CM | POA: Diagnosis not present

## 2021-10-21 DIAGNOSIS — E785 Hyperlipidemia, unspecified: Secondary | ICD-10-CM | POA: Diagnosis not present

## 2021-10-21 DIAGNOSIS — I10 Essential (primary) hypertension: Secondary | ICD-10-CM | POA: Diagnosis not present

## 2021-10-21 DIAGNOSIS — Z0000 Encounter for general adult medical examination without abnormal findings: Secondary | ICD-10-CM | POA: Diagnosis not present

## 2021-11-14 DIAGNOSIS — G510 Bell's palsy: Secondary | ICD-10-CM | POA: Diagnosis not present

## 2021-11-14 DIAGNOSIS — B0221 Postherpetic geniculate ganglionitis: Secondary | ICD-10-CM | POA: Diagnosis not present

## 2021-11-14 DIAGNOSIS — R471 Dysarthria and anarthria: Secondary | ICD-10-CM | POA: Diagnosis not present

## 2022-01-09 DIAGNOSIS — G5133 Clonic hemifacial spasm, bilateral: Secondary | ICD-10-CM | POA: Diagnosis not present

## 2022-01-09 DIAGNOSIS — B0221 Postherpetic geniculate ganglionitis: Secondary | ICD-10-CM | POA: Insufficient documentation

## 2022-01-09 DIAGNOSIS — G510 Bell's palsy: Secondary | ICD-10-CM | POA: Diagnosis not present

## 2022-01-09 DIAGNOSIS — I10 Essential (primary) hypertension: Secondary | ICD-10-CM | POA: Insufficient documentation

## 2022-02-06 DIAGNOSIS — B0221 Postherpetic geniculate ganglionitis: Secondary | ICD-10-CM | POA: Diagnosis not present

## 2022-02-06 DIAGNOSIS — G510 Bell's palsy: Secondary | ICD-10-CM | POA: Diagnosis not present

## 2022-02-06 DIAGNOSIS — R471 Dysarthria and anarthria: Secondary | ICD-10-CM | POA: Diagnosis not present

## 2022-05-30 ENCOUNTER — Other Ambulatory Visit: Payer: Self-pay | Admitting: Family Medicine

## 2022-05-30 DIAGNOSIS — Z1231 Encounter for screening mammogram for malignant neoplasm of breast: Secondary | ICD-10-CM

## 2022-06-18 ENCOUNTER — Ambulatory Visit: Payer: Federal, State, Local not specified - PPO

## 2022-07-01 ENCOUNTER — Ambulatory Visit
Admission: RE | Admit: 2022-07-01 | Discharge: 2022-07-01 | Disposition: A | Discharge status: Home / Self Care | Payer: Federal, State, Local not specified - PPO | Source: Ambulatory Visit | Attending: Family Medicine | Admitting: Family Medicine

## 2022-07-01 DIAGNOSIS — Z1231 Encounter for screening mammogram for malignant neoplasm of breast: Secondary | ICD-10-CM | POA: Diagnosis not present

## 2022-07-28 ENCOUNTER — Ambulatory Visit (HOSPITAL_COMMUNITY): Payer: Federal, State, Local not specified - PPO

## 2022-07-28 ENCOUNTER — Other Ambulatory Visit: Payer: Self-pay

## 2022-07-28 ENCOUNTER — Ambulatory Visit (HOSPITAL_COMMUNITY)
Admission: EM | Admit: 2022-07-28 | Discharge: 2022-07-28 | Disposition: A | Discharge status: Home / Self Care | Payer: Federal, State, Local not specified - PPO | Attending: Family Medicine | Admitting: Family Medicine

## 2022-07-28 ENCOUNTER — Encounter (HOSPITAL_COMMUNITY): Payer: Self-pay

## 2022-07-28 DIAGNOSIS — R079 Chest pain, unspecified: Secondary | ICD-10-CM | POA: Diagnosis not present

## 2022-07-28 NOTE — ED Triage Notes (Signed)
Pt presents to office for mid central chest pain that woke her up out of sleep last night. Patient took Asprin and seemed to help.  Pain 04/10

## 2022-07-28 NOTE — Discharge Instructions (Signed)
You have been seen at the [facility] today for chest pain. Your evaluation today was not suggestive of any emergent condition requiring medical intervention at this time. Your chest x-ray and ECG (heart tracing) did not show any worrisome changes. However, some medical problems make take more time to appear. Therefore, it's very important that you pay attention to any new symptoms or worsening of your current condition.  Please proceed directly to the Emergency Department immediately should you feel worse in any way or have any of the following symptoms: increasing or different chest pain, pain that spreads to your arm, neck, jaw, back or abdomen, shortness of breath, or nausea and vomiting.  

## 2022-07-30 NOTE — ED Provider Notes (Signed)
[facility]   [id number] 07/28/22 Arrival Time: 1208  ASSESSMENT & PLAN:  1. Nonspecific chest pain    Low suspicion of cardiac etiology but discussed that I am limited on evaluation here. Declines ED evaluation; symptom-free at this time.  ECG: Performed today and interpreted by me: normal EKG, normal sinus rhythm. No acute changes. No STEMI.  I have personally viewed and independently interpreted the imaging studies ordered this visit. No acute changes on CXR. No pneumothorax.    Discharge Instructions      You have been seen at the [facility] today for chest pain. Your evaluation today was not suggestive of any emergent condition requiring medical intervention at this time. Your chest x-ray and ECG (heart tracing) did not show any worrisome changes. However, some medical problems make take more time to appear. Therefore, it's very important that you pay attention to any new symptoms or worsening of your current condition.  Please proceed directly to the Emergency Department immediately should you feel worse in any way or have any of the following symptoms: increasing or different chest pain, pain that spreads to your arm, neck, jaw, back or abdomen, shortness of breath, or nausea and vomiting.      Reviewed expectations re: course of current medical issues. Questions answered. Outlined signs and symptoms indicating need for more acute intervention. Patient verbalized understanding. After Visit Summary given.   SUBJECTIVE:  History from: patient. Caroline Novak is a 52 y.o. female who presents with complaint of a single episode of mid to upper L chest pain that woke her from sleep early this am; lasted few min; non-radiating; now symptom free. Denies associated n/v/diaphoresis/SOB. Much stress recently and ques if this could be related. Took ASA at time of pain; "maybe helped". Injury or recent strenuous activity: denies. Denies: fatigue,  irregular heart beat, lower extremity edema, near-syncope, orthopnea, palpitations, paroxysmal nocturnal dyspnea, and syncope. Recent illnesses: none. Fever: absent. Ambulatory without assistance. Denies recreational drug use.  Social History   Tobacco Use  Smoking Status Never  Smokeless Tobacco Never   Social History   Substance and Sexual Activity  Alcohol Use No     OBJECTIVE:  Vitals:   07/28/22 1235  BP: (!) 121/90  Pulse: 82  Resp: 18  Temp: 98.1 F (36.7 C)  TempSrc: Oral  SpO2: 98%    General appearance: alert, oriented, no acute distress Eyes: PERRLA; EOMI; conjunctivae normal HENT: normocephalic; atraumatic Neck: supple with FROM Lungs: without labored respirations; speaks full sentences without difficulty; CTAB Heart: regular rate and rhythm without murmer Chest Wall: without tenderness to palpation Abdomen: soft, non-tender; no guarding or rebound tenderness Extremities: without edema; without calf swelling or tenderness; symmetrical without gross deformities Skin: warm and dry; without rash or lesions Neuro: normal gait Psychological: alert and cooperative; normal mood and affect   Imaging: DG Chest 2 View  Result Date: 07/28/2022 CLINICAL DATA:  Chest pain beginning last night EXAM: CHEST - 2 VIEW COMPARISON:  None Available. FINDINGS: Heart size is normal. Mediastinal shadows are normal. The lungs are clear. No bronchial thickening. No infiltrate, mass, effusion or collapse. Pulmonary vascularity is normal. No bony abnormality. IMPRESSION: Normal chest radiography. Electronically Signed   By: Paulina Fusi M.D.   On: 07/28/2022 13:16   No results found.   Allergies  Allergen Reactions   Lisinopril Swelling    Past Medical History:  Diagnosis Date   Fibroadenoma of breast    Heart murmur  Hypertension    Ramsay Hunt syndrome (geniculate herpes zoster)    Shingles    Social History   Socioeconomic History   Marital status: Single     Spouse name: Not on file   Number of children: Not on file   Years of education: Not on file   Highest education level: Not on file  Occupational History   Not on file  Tobacco Use   Smoking status: Never   Smokeless tobacco: Never  Substance and Sexual Activity   Alcohol use: No   Drug use: No   Sexual activity: Not on file  Other Topics Concern   Not on file  Social History Narrative   Not on file   Social Determinants of Health   Financial Resource Strain: Not on file  Food Insecurity: Not on file  Transportation Needs: Not on file  Physical Activity: Not on file  Stress: Not on file  Social Connections: Not on file  Intimate Partner Violence: Not on file   Family History  Problem Relation Age of Onset   Breast cancer Neg Hx    Past Surgical History:  Procedure Laterality Date   ABDOMINAL HYSTERECTOMY N/A 07/13/2014   Procedure: HYSTERECTOMY ABDOMINAL;  Surgeon: Myna Hidalgo, DO;  Location: [facility];  Service: Gynecology;  Laterality: N/A;   BILATERAL SALPINGECTOMY Bilateral 07/13/2014   Procedure: BILATERAL SALPINGECTOMY;  Surgeon: Myna Hidalgo, DO;  Location: [facility];  Service: Gynecology;  Laterality: Bilateral;   BREAST EXCISIONAL BIOPSY Left 2013   BREAST SURGERY  08/19/2011   LT br mass removal   ENDOMETRIAL CRYOABLATION     LYSIS OF ADHESION N/A 07/13/2014   Procedure: EXTENSIVE LYSIS OF ADHESION;  Surgeon: Myna Hidalgo, DO;  Location: [facility];  Service: Gynecology;  Laterality: N/A;   NO PAST SURGERIES        Mardella Layman, MD 07/30/22 1346

## 2022-10-31 DIAGNOSIS — E785 Hyperlipidemia, unspecified: Secondary | ICD-10-CM | POA: Diagnosis not present

## 2022-10-31 DIAGNOSIS — Z23 Encounter for immunization: Secondary | ICD-10-CM | POA: Diagnosis not present

## 2022-10-31 DIAGNOSIS — Z0000 Encounter for general adult medical examination without abnormal findings: Secondary | ICD-10-CM | POA: Diagnosis not present

## 2022-10-31 DIAGNOSIS — I10 Essential (primary) hypertension: Secondary | ICD-10-CM | POA: Diagnosis not present

## 2023-02-11 DIAGNOSIS — H4710 Unspecified papilledema: Secondary | ICD-10-CM | POA: Diagnosis not present

## 2023-02-11 DIAGNOSIS — H5319 Other subjective visual disturbances: Secondary | ICD-10-CM | POA: Diagnosis not present

## 2023-02-23 ENCOUNTER — Encounter: Payer: Self-pay | Admitting: Neurology

## 2023-02-23 ENCOUNTER — Ambulatory Visit: Payer: Federal, State, Local not specified - PPO | Admitting: Neurology

## 2023-02-23 VITALS — BP 134/86 | HR 89 | Ht 62.5 in | Wt 248.0 lb

## 2023-02-23 DIAGNOSIS — H9313 Tinnitus, bilateral: Secondary | ICD-10-CM

## 2023-02-23 DIAGNOSIS — R7309 Other abnormal glucose: Secondary | ICD-10-CM

## 2023-02-23 DIAGNOSIS — G8929 Other chronic pain: Secondary | ICD-10-CM

## 2023-02-23 DIAGNOSIS — R51 Headache with orthostatic component, not elsewhere classified: Secondary | ICD-10-CM

## 2023-02-23 DIAGNOSIS — R5383 Other fatigue: Secondary | ICD-10-CM

## 2023-02-23 DIAGNOSIS — G441 Vascular headache, not elsewhere classified: Secondary | ICD-10-CM

## 2023-02-23 DIAGNOSIS — H53423 Scotoma of blind spot area, bilateral: Secondary | ICD-10-CM

## 2023-02-23 DIAGNOSIS — H5460 Unqualified visual loss, one eye, unspecified: Secondary | ICD-10-CM | POA: Diagnosis not present

## 2023-02-23 DIAGNOSIS — H4710 Unspecified papilledema: Secondary | ICD-10-CM

## 2023-02-23 DIAGNOSIS — G932 Benign intracranial hypertension: Secondary | ICD-10-CM | POA: Diagnosis not present

## 2023-02-23 DIAGNOSIS — H9193 Unspecified hearing loss, bilateral: Secondary | ICD-10-CM

## 2023-02-23 NOTE — Progress Notes (Addendum)
 [facility]    Provider:  Dr Lucia Gaskins Requesting Provider: Ernesto Rutherford, MD Primary Care Provider:  Joycelyn Rua, MD  CC:  Vision loss  03/02/2023:Esr 68, crp 20 with vision loss. Have to check for temporal arteritis however. Sent in steroids and will see if we can get a biopsy  HPI:  Caroline Novak is a 53 y.o. 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   Symptoms started 1-2 months ago. She was asleep and had to awake to go to the bathroom and she noticed vision changes in one eye it looked like a shade of grey, one eye was fine and the other eye had dimmed vision, would rpotate between eyes, always when would awake from sleep. Doesn't happen during the day. It continues for 20-25 minutes and then the vision comes back to normal. No headaches, Nothing out of the ordinary during the day. And when she had the symptoms no headache, no pain in the eye, she does get headaches sometimes every now and then, feels like an ache on the left frontal portion of the head, nmothig has changed with the headache as far as severity or frequency, had occ headaches for years, not migrainous, no vision changes during the day, no hearing problems, no sensation of fluid in the ears or swooshing int he ears, no pulsatile tinnitus, she has new tinnitus in the ears randomly but this is new. No inciting events known, no new meds, no illnesses, no majot weight gain, No FHX of eye disease, autoimmune disordes, MS or anythign similar. She sleeps well at night. She doesn't know if snores. She does not feel refreshed in the mornings but she attributes it for stress. She does not nap during the day, not excessively tired, does not wake often at night, + morning headaches. No  other focal neurologic deficits, associated symptoms, inciting events or modifiable factors.  Reviewed notes, labs and imaging from outside physicians, which showed:  06/2020 CT Head CLINICAL DATA:  Headache, intracranial hemorrhage suspected   Worsening headache.   EXAM: CT HEAD WITHOUT CONTRAST   TECHNIQUE: Contiguous axial images were obtained from the base of the skull through the vertex without intravenous contrast.   COMPARISON:  Head CT and brain MRI August of 2019   FINDINGS: Brain: No intracranial hemorrhage, mass effect, or midline shift. No hydrocephalus. The basilar cisterns are patent. No evidence of territorial infarct or acute ischemia. No extra-axial or intracranial fluid collection.   Vascular: No hyperdense vessel or unexpected calcification.   Skull: Normal. Negative for fracture or focal lesion.   Sinuses/Orbits: Paranasal sinuses and mastoid air cells are clear. The visualized orbits are unremarkable.   Other: None.   IMPRESSION: Negative noncontrast head CT.     Electronically Signed   By: Narda Rutherford M.D.   On: 07/06/2020 21:09  MRI 2019: CLINICAL DATA:  Facial paresthesia. Left arm and hand numbness and tingling. History of Ramsay-Hunt syndrome.   EXAM: MRI HEAD WITHOUT CONTRAST   TECHNIQUE: Multiplanar, multiecho pulse sequences of the brain and surrounding structures were obtained without intravenous contrast.   COMPARISON:  Head CT 08/19/2017   FINDINGS: BRAIN: There is no acute infarct, acute hemorrhage or mass effect. The midline structures are normal. There are no old infarcts. The white matter signal is normal  for the patient's age. The CSF spaces are normal for age, with no hydrocephalus. Susceptibility-sensitive sequences show no chronic microhemorrhage or superficial siderosis.   VASCULAR: Major intracranial arterial and venous sinus flow voids are preserved.   SKULL AND UPPER CERVICAL SPINE: The visualized skull  base, calvarium, upper cervical spine and extracranial soft tissues are normal.   SINUSES/ORBITS: No fluid levels or advanced mucosal thickening. No mastoid or middle ear effusion. The orbits are normal.   IMPRESSION: Normal brain MRI.  Review of Systems: Patient complains of symptoms per HPI as well as the following symptoms none. Pertinent negatives and positives per HPI. All others negative.   Social History   Socioeconomic History   Marital status: Single    Spouse name: Not on file   Number of children: Not on file   Years of education: Not on file   Highest education level: Not on file  Occupational History   Not on file  Tobacco Use   Smoking status: Never   Smokeless tobacco: Never  Vaping Use   Vaping status: Never Used  Substance and Sexual Activity   Alcohol use: No   Drug use: No   Sexual activity: Not on file  Other Topics Concern   Not on file  Social History Narrative   Caffiene not often.    Working at [employer] son, no pets.     Social Drivers of Corporate investment banker Strain: Not on file  Food Insecurity: Not on file  Transportation Needs: Not on file  Physical Activity: Not on file  Stress: Not on file  Social Connections: Not on file  Intimate Partner Violence: Not on file    Family History  Problem Relation Age of Onset   Hypertension Mother    Breast cancer Neg Hx    Migraines Neg Hx     Past Medical History:  Diagnosis Date   Fibroadenoma of breast    Heart murmur    Hypertension    Ramsay Hunt syndrome (geniculate herpes zoster)    Shingles     Patient Active Problem List   Diagnosis Date Noted   High blood pressure 01/09/2022   Ramsay Hunt syndrome (geniculate herpes zoster) 01/09/2022   Obesity 06/07/2019   Abnormal uterine bleeding 07/13/2014    Past Surgical History:  Procedure Laterality Date   ABDOMINAL HYSTERECTOMY N/A 07/13/2014   Procedure: HYSTERECTOMY ABDOMINAL;  Surgeon: Myna Hidalgo, DO;   Location: [facility];  Service: Gynecology;  Laterality: N/A;   BILATERAL SALPINGECTOMY Bilateral 07/13/2014   Procedure: BILATERAL SALPINGECTOMY;  Surgeon: Myna Hidalgo, DO;  Location: [facility];  Service: Gynecology;  Laterality: Bilateral;   BREAST EXCISIONAL BIOPSY Left 2013   BREAST SURGERY  08/19/2011   LT br mass removal   ENDOMETRIAL CRYOABLATION     LYSIS OF ADHESION N/A 07/13/2014   Procedure: EXTENSIVE LYSIS OF ADHESION;  Surgeon: Myna Hidalgo, DO;  Location: [facility];  Service: Gynecology;  Laterality: N/A;    Current Outpatient Medications  Medication Sig Dispense Refill   hydrochlorothiazide (MICROZIDE) 12.5 MG capsule Take 12.5 mg by mouth daily.     amLODipine (NORVASC) 10 MG tablet Take 10 mg by mouth.     No current facility-administered medications for this visit.    Allergies as of 02/23/2023 - Review Complete 02/23/2023  Allergen Reaction Noted   Lisinopril Swelling 07/06/2020    Vitals: BP 134/86 (Cuff Size: Large)   Pulse 89   Ht 5' 2.5" (1.588 m)  Wt 248 lb (112.5 kg)   LMP 06/29/2014 (Exact Date)   BMI 44.64 kg/m  Last Weight:  Wt Readings from Last 1 Encounters:  02/23/23 248 lb (112.5 kg)   Last Height:   Ht Readings from Last 1 Encounters:  02/23/23 5' 2.5" (1.588 m)     Physical exam: Exam: Gen: NAD, conversant, well nourised, obese, well groomed                     CV: RRR, no MRG. No Carotid Bruits. + peripheral edema in lower extremities, warm, nontender Eyes: Conjunctivae clear without exudates or hemorrhage  Neuro: Detailed Neurologic Exam  Speech:    Speech is normal; fluent and spontaneous with normal comprehension.  Cognition:    The patient is oriented to person, place, and time;     recent and remote memory intact;     language fluent;     normal attention, concentration,     fund of knowledge Cranial Nerves:    The pupils are equal, round, and reactive to light.  Attempted, could not visualize fundi due to small pupils.  Visual fields are full to finger confrontation. Extraocular movements are intact. Trigeminal sensation is intact and the muscles of mastication are normal. Left weakness face in eye closure (with bells phenomenon) and left lower facial weakness. The palate elevates in the midline. Hearing intact. Voice is normal. Shoulder shrug is normal. The tongue has normal motion without fasciculations.   Coordination: nml  Gait: nml  Motor Observation:    No asymmetry, no atrophy, and no involuntary movements noted. Tone:    Normal muscle tone.    Posture:    Posture is normal. normal erect    Strength:    Strength is V/V in the upper and lower limbs.      Sensation: intact to LT     Reflex Exam:  DTR's:    Deep tendon reflexes in the upper and lower extremities are normal bilaterally.   Toes:    The toes are equiv bilaterally.   Clonus:    Clonus is absent.    Assessment/Plan: This is a lovely patient here as a referral from our wonderful ophthalmology colleague Dr. Ernesto Rutherford for possible IDIOPATHIC INTRACRANIAL HYPERTENSION in the setting of vision loss.  Patient is morbidly obese, has high blood pressure, history of Ramsay Hunt syndrome on the left, who started having multiple episodes of monocular vision loss but could be on either side.  She describes it as a shade of gray, when I will be fine and the other head dimmed, usually after laying down for a period, dimmed vision, always nocturnal, she also reports headaches and ache in the left frontal portion of the head and has had occasional headaches for years but not migrainous.  She does also describe new tinnitus in the ears.  03/02/2023:Esr 68, crp 20 with vision loss. Have to check for temporal arteritis however. Sent in steroids and will see if we can get a biopsy  Reviewed ophthalmology notes, visual field show minimally enlarged blind spot OU, nerves are extremely tilted and very hard to judge but there is superior nerve edema  both the OCT and fundus exam.  Was sent over urgently for neurologic workup for possible IIH and to consider a sleep study.  Diagnosed with optic nerve edema OU.  Ophthalmology recommended sleep study but she reports she sleeps well at night. She doesn't know if snores. She does not feel refreshed some mornings but  she attributes it for stress. She does not nap during the day, not excessively tired, does not wake often at night, no morning headaches. We can send an overnight oxygenation monitor to see if there is hypoxemia at night.  MRI of the brain w/wo contrast and also take a look at the orbits: MRI of the brain and orbits due to vision loss, papilledema, new onset tinnitus and hearing changes, headaches, enlarged blind spots OU to evaluate for space-occupying mass, intracranial hypertension, infarcts or vasculitides, Chiari malformations and other etiologies of intracranial hypertension.    Also given papilledema and vision loss require MRI of the orbits to further evaluate the optic nerves, optic neuritis,compressive cavernous lesions, orbital pseudotumor.    Also after MRI brain and workup may consider CT of the blood vessels of the head Overnight oxygenation test Based on MRI brain consider lumbar puncture to check fluid around the brain including pressure and possibly a few other tests if indicated Come back for bloodwork Friday between 8-11 am   Weight loss is critical, discussed weight loss and risk of permanent vision loss   Remove the nexplanon   If negative above then CTV for venous thrombosis and start Diamox 250 bid and increase to 500mg  bid at next appointment.   Labs today  For any acute change especially worsening headache or vision loss call 911 and proceed to ED  To prevent or relieve headaches, try the following: Cool Compress. Lie down and place a cool compress on your head.  Avoid headache triggers. If certain foods or odors seem to have triggered your migraines in  the past, avoid them. A headache diary might help you identify triggers.  Include physical activity in your daily routine. Try a daily walk or other moderate aerobic exercise.  Manage stress. Find healthy ways to cope with the stressors, such as delegating tasks on your to-do list.  Practice relaxation techniques. Try deep breathing, yoga, massage and visualization.  Eat regularly. Eating regularly scheduled meals and maintaining a healthy diet might help prevent headaches. Also, drink plenty of fluids.  Follow a regular sleep schedule. Sleep deprivation might contribute to headaches Consider biofeedback. With this mind-body technique, you learn to control certain bodily functions -- such as muscle tension, heart rate and blood pressure -- to prevent headaches or reduce headache pain.    Proceed to emergency room if you experience new or worsening symptoms or symptoms do not resolve, if you have new neurologic symptoms or if headache is severe, or for any concerning symptom.   Provided education and documentation from American headache Society toolbox including articles on: pseudotumoer cerebri(IIH), chronic migraine medication overuse headache, chronic migraines, prevention of migraines and other headaches, behavioral and other nonpharmacologic treatments for headache.   Orders Placed This Encounter  Procedures   MR BRAIN W WO CONTRAST   MR ORBITS W WO CONTRAST   DG FL GUIDED LUMBAR PUNCTURE   Hemoglobin A1c   CBC with Differential/Platelets   Comprehensive metabolic panel   TSH Rfx on Abnormal to Free T4   Sedimentation rate   C-reactive protein   No orders of the defined types were placed in this encounter.   Cc: Ernesto Rutherford, MD,  Joycelyn Rua, MD  Naomie Dean, MD  [facility] [address] [place], [place] [address]  Phone [phone] Fax [phone]

## 2023-02-23 NOTE — Patient Instructions (Addendum)
 MRI of the brain w/wo contrast and also take a look at the orbits Also after MRI brain and workup may consider CT of the blood vessels of the head Overnight oxygenation test Based on MRI brain consider lumbar puncture to check fluid around the brain including pressure and possibly a few other tests if indicated Come back for bloodwork Friday between 8-11 am  Idiopathic Intracranial Hypertension  Idiopathic intracranial hypertension (IIH) is a condition that increases pressure around the brain. The fluid that surrounds the brain and spinal cord (cerebrospinal fluid, or CSF) increases and causes the pressure. Idiopathic means that the cause of this condition is not known. IIH affects the brain and spinal cord. If this condition is not treated, it can cause vision loss or blindness. What are the causes? The cause of this condition is not known. What increases the risk? The following factors may make you more likely to develop this condition: Being obese. Being a person who is female, between the ages of 81 and 55 years old, and who has not gone through menopause. Taking certain medicines, such as birth control, acne medicines, or steroids. What are the signs or symptoms? Symptoms of this condition include: Headaches. This is the most common symptom. Brief periods of total blindness. Double vision, blurred vision, or poor side (peripheral) vision. Pain in the shoulders or neck. Nausea and vomiting. A sound like rushing water or a pulsing sound within the ears (pulsatile tinnitus), or ringing in the ears. How is this diagnosed? This condition may be diagnosed based on: Your symptoms and medical history. Imaging tests of the brain, such as: CT scan. MRI. Magnetic resonance venogram (MRV) to check the veins. Diagnostic lumbar puncture. This is a procedure to remove and examine a sample of CSF. This procedure can determine whether your fluid pressure is too high. An eye exam to check for  swelling or nerve damage in the eyes. How is this treated? Treatment for this condition depends on the symptoms. The goal of treatment is to decrease the pressure around your brain. Common treatments include: Weight loss through healthy eating, salt restriction, and exercise, if you are overweight. Medicines to decrease the production of CSF and lower the pressure within your skull. Medicines to prevent or treat headaches. Other treatments may include: Surgery to place drains (shunts) in your brain to remove extra fluid. Lumbar puncture to remove extra CSF. Follow these instructions at home: If you are overweight or obese, work with your health care provider to lose weight. Take over-the-counter and prescription medicines only as told by your health care provider. Ask your health care provider if the medicine prescribed to you requires you to avoid driving or using machinery. Do not use any products that contain nicotine or tobacco. These products include cigarettes, chewing tobacco, and vaping devices, such as e-cigarettes. If you need help quitting, ask your health care provider. Keep all follow-up visits. Your health care provider will need to monitor you regularly. Contact a health care provider if: You have changes in your vision, such as: Double vision. Blurred vision. Poor peripheral vision. Get help right away if: You have any of the following symptoms and they get worse or do not get better: Headaches. Nausea. Vomiting. Sudden trouble seeing. This information is not intended to replace advice given to you by your health care provider. Make sure you discuss any questions you have with your health care provider. Document Revised: 05/28/2021 Document Reviewed: 05/07/2021 Elsevier Patient Education  2024 [employer].

## 2023-02-24 ENCOUNTER — Encounter: Payer: Self-pay | Admitting: Neurology

## 2023-02-26 ENCOUNTER — Telehealth: Payer: Self-pay | Admitting: *Deleted

## 2023-02-26 DIAGNOSIS — R519 Headache, unspecified: Secondary | ICD-10-CM

## 2023-02-26 DIAGNOSIS — G4734 Idiopathic sleep related nonobstructive alveolar hypoventilation: Secondary | ICD-10-CM

## 2023-02-26 NOTE — Telephone Encounter (Signed)
ONO order placed and order sent to [facility].

## 2023-02-26 NOTE — Telephone Encounter (Signed)
-----   Message from Anson Fret sent at 02/24/2023  8:16 PM EST ----- Regarding: Please send an overnight oxygenation monitor Please send an overnight oxygenation monitor (and show me the order please) she does not use oxygen. For morning headaches, overnight/nocturnal hypoxemia

## 2023-02-26 NOTE — Telephone Encounter (Signed)
Zott, Linnell Fulling, Otilio Jefferson, RN; [place], [place] Got it thank you

## 2023-02-27 ENCOUNTER — Other Ambulatory Visit (INDEPENDENT_AMBULATORY_CARE_PROVIDER_SITE_OTHER): Payer: Self-pay

## 2023-02-27 ENCOUNTER — Other Ambulatory Visit: Payer: Self-pay

## 2023-02-27 DIAGNOSIS — Z0289 Encounter for other administrative examinations: Secondary | ICD-10-CM

## 2023-02-27 DIAGNOSIS — R7309 Other abnormal glucose: Secondary | ICD-10-CM

## 2023-02-27 DIAGNOSIS — G932 Benign intracranial hypertension: Secondary | ICD-10-CM

## 2023-02-27 DIAGNOSIS — R5383 Other fatigue: Secondary | ICD-10-CM

## 2023-02-28 LAB — CBC WITH DIFFERENTIAL/PLATELET
Basophils Absolute: 0 10*3/uL (ref 0.0–0.2)
Basos: 1 %
EOS (ABSOLUTE): 0.1 10*3/uL (ref 0.0–0.4)
Eos: 2 %
Hematocrit: 39.7 % (ref 34.0–46.6)
Hemoglobin: 13.2 g/dL (ref 11.1–15.9)
Immature Grans (Abs): 0 10*3/uL (ref 0.0–0.1)
Immature Granulocytes: 0 %
Lymphocytes Absolute: 1.4 10*3/uL (ref 0.7–3.1)
Lymphs: 29 %
MCH: 27.9 pg (ref 26.6–33.0)
MCHC: 33.2 g/dL (ref 31.5–35.7)
MCV: 84 fL (ref 79–97)
Monocytes Absolute: 0.4 10*3/uL (ref 0.1–0.9)
Monocytes: 9 %
Neutrophils Absolute: 2.9 10*3/uL (ref 1.4–7.0)
Neutrophils: 59 %
Platelets: 246 10*3/uL (ref 150–450)
RBC: 4.73 x10E6/uL (ref 3.77–5.28)
RDW: 13.8 % (ref 11.7–15.4)
WBC: 4.9 10*3/uL (ref 3.4–10.8)

## 2023-02-28 LAB — COMPREHENSIVE METABOLIC PANEL
ALT: 14 [IU]/L (ref 0–32)
AST: 13 [IU]/L (ref 0–40)
Albumin: 4.3 g/dL (ref 3.8–4.9)
Alkaline Phosphatase: 100 [IU]/L (ref 44–121)
BUN/Creatinine Ratio: 14 (ref 9–23)
BUN: 13 mg/dL (ref 6–24)
Bilirubin Total: 0.6 mg/dL (ref 0.0–1.2)
CO2: 24 mmol/L (ref 20–29)
Calcium: 9.3 mg/dL (ref 8.7–10.2)
Chloride: 102 mmol/L (ref 96–106)
Creatinine, Ser: 0.93 mg/dL (ref 0.57–1.00)
Globulin, Total: 3 g/dL (ref 1.5–4.5)
Glucose: 103 mg/dL — ABNORMAL HIGH (ref 70–99)
Potassium: 3.4 mmol/L — ABNORMAL LOW (ref 3.5–5.2)
Sodium: 142 mmol/L (ref 134–144)
Total Protein: 7.3 g/dL (ref 6.0–8.5)
eGFR: 73 mL/min/{1.73_m2} (ref >59)

## 2023-02-28 LAB — HEMOGLOBIN A1C
Est. average glucose Bld gHb Est-mCnc: 120 mg/dL
Hgb A1c MFr Bld: 5.8 % — ABNORMAL HIGH (ref 4.8–5.6)

## 2023-02-28 LAB — C-REACTIVE PROTEIN: CRP: 20 mg/L — ABNORMAL HIGH (ref 0–10)

## 2023-02-28 LAB — TSH RFX ON ABNORMAL TO FREE T4: TSH: 3.16 u[IU]/mL (ref 0.450–4.500)

## 2023-02-28 LAB — SEDIMENTATION RATE: Sed Rate: 71 mm/h — ABNORMAL HIGH (ref 0–40)

## 2023-03-02 ENCOUNTER — Telehealth: Payer: Self-pay | Admitting: Neurology

## 2023-03-02 ENCOUNTER — Other Ambulatory Visit: Payer: Self-pay | Admitting: Neurology

## 2023-03-02 DIAGNOSIS — M316 Other giant cell arteritis: Secondary | ICD-10-CM

## 2023-03-02 MED ORDER — PREDNISONE 20 MG PO TABS: 60.0000 mg | ORAL_TABLET | Freq: Every day | ORAL | 1 refills | Status: AC

## 2023-03-02 NOTE — Telephone Encounter (Signed)
 Ptient with vision changes, greying out, nice, 53 yr old, ESR 70 CRP 20 I'm going to start her on steroids and sent for biopsy? Discussed with her, will see if Dr. Donell Beers or one of her colleagues can help

## 2023-03-02 NOTE — Telephone Encounter (Signed)
 Referral for general surgery fax to [facility]. Phone: [phone], Fax: [phone]

## 2023-03-03 ENCOUNTER — Telehealth: Payer: Self-pay | Admitting: Neurology

## 2023-03-03 NOTE — Telephone Encounter (Signed)
 I let the patient a message that her MRI tomorrow needs to be rescheduled due to our office closing for the snow.

## 2023-03-04 ENCOUNTER — Other Ambulatory Visit: Payer: Federal, State, Local not specified - PPO

## 2023-03-09 ENCOUNTER — Other Ambulatory Visit: Payer: Self-pay | Admitting: General Surgery

## 2023-03-09 DIAGNOSIS — H547 Unspecified visual loss: Secondary | ICD-10-CM | POA: Diagnosis not present

## 2023-03-09 DIAGNOSIS — R700 Elevated erythrocyte sedimentation rate: Secondary | ICD-10-CM | POA: Diagnosis not present

## 2023-03-09 DIAGNOSIS — R519 Headache, unspecified: Secondary | ICD-10-CM | POA: Diagnosis not present

## 2023-03-09 DIAGNOSIS — R7982 Elevated C-reactive protein (CRP): Secondary | ICD-10-CM | POA: Diagnosis not present

## 2023-03-11 ENCOUNTER — Encounter (HOSPITAL_BASED_OUTPATIENT_CLINIC_OR_DEPARTMENT_OTHER): Payer: Self-pay | Admitting: General Surgery

## 2023-03-11 ENCOUNTER — Encounter: Payer: Self-pay | Admitting: Neurology

## 2023-03-11 ENCOUNTER — Other Ambulatory Visit: Payer: Self-pay

## 2023-03-11 ENCOUNTER — Ambulatory Visit: Payer: Federal, State, Local not specified - PPO

## 2023-03-11 DIAGNOSIS — H9313 Tinnitus, bilateral: Secondary | ICD-10-CM | POA: Diagnosis not present

## 2023-03-11 DIAGNOSIS — G441 Vascular headache, not elsewhere classified: Secondary | ICD-10-CM

## 2023-03-11 DIAGNOSIS — H9193 Unspecified hearing loss, bilateral: Secondary | ICD-10-CM

## 2023-03-11 DIAGNOSIS — G8929 Other chronic pain: Secondary | ICD-10-CM

## 2023-03-11 DIAGNOSIS — H5460 Unqualified visual loss, one eye, unspecified: Secondary | ICD-10-CM | POA: Diagnosis not present

## 2023-03-11 DIAGNOSIS — H4710 Unspecified papilledema: Secondary | ICD-10-CM

## 2023-03-11 DIAGNOSIS — R51 Headache with orthostatic component, not elsewhere classified: Secondary | ICD-10-CM | POA: Diagnosis not present

## 2023-03-11 DIAGNOSIS — H53423 Scotoma of blind spot area, bilateral: Secondary | ICD-10-CM

## 2023-03-11 MED ORDER — GADOBENATE DIMEGLUMINE 529 MG/ML IV SOLN
20.0000 mL | Freq: Once | INTRAVENOUS | Status: AC | PRN
  Administered 2023-03-11: 20 mL via INTRAVENOUS

## 2023-03-13 MED ORDER — CHLORHEXIDINE GLUCONATE CLOTH 2 % EX PADS: 6.0000 | MEDICATED_PAD | Freq: Once | CUTANEOUS | Status: DC

## 2023-03-13 NOTE — Progress Notes (Signed)

## 2023-03-16 ENCOUNTER — Telehealth: Payer: Self-pay | Admitting: Neurology

## 2023-03-16 ENCOUNTER — Encounter: Payer: Self-pay | Admitting: Neurology

## 2023-03-16 DIAGNOSIS — G441 Vascular headache, not elsewhere classified: Secondary | ICD-10-CM

## 2023-03-16 DIAGNOSIS — G932 Benign intracranial hypertension: Secondary | ICD-10-CM

## 2023-03-16 DIAGNOSIS — G08 Intracranial and intraspinal phlebitis and thrombophlebitis: Secondary | ICD-10-CM

## 2023-03-16 NOTE — Anesthesia Preprocedure Evaluation (Signed)
 Anesthesia Evaluation  Patient identified by MRN, date of birth, ID band Patient awake    Reviewed: Allergy & Precautions, NPO status , Patient's Chart, lab work & pertinent test results  History of Anesthesia Complications Negative for: history of anesthetic complications  Airway Mallampati: III  TM Distance: >3 FB Neck ROM: Full   Comment: Previous grade I view with MAC 3, easy mask Dental  (+) Dental Advisory Given   Pulmonary neg pulmonary ROS   Pulmonary exam normal breath sounds clear to auscultation       Cardiovascular hypertension (amlodipine, HCTZ), Pt. 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    GI/Hepatic Neg liver ROS,GERD  ,,  Endo/Other  neg diabetes  Class 3 obesity  Renal/GU negative Renal ROS     Musculoskeletal   Abdominal  (+) + obese  Peds  Hematology negative hematology ROS (+) Lab Results      Component                Value               Date                      WBC                      4.9                 02/27/2023                HGB                      13.2                02/27/2023                HCT                      39.7                02/27/2023                MCV                      84                  02/27/2023                PLT                      246                 02/27/2023              Anesthesia Other Findings Completed 2 week prednisone course yesterday.  Reproductive/Obstetrics                             Anesthesia Physical Anesthesia Plan  ASA: 3  Anesthesia Plan: General   Post-op Pain Management:    Induction: Intravenous  PONV Risk Score and Plan: 3 and Ondansetron, Dexamethasone and Treatment may vary due to  age or medical condition  Airway Management  Planned: LMA  Additional Equipment:   Intra-op Plan:   Post-operative Plan: Extubation in OR  Informed Consent: I have reviewed the patients History and Physical, chart, labs and discussed the procedure including the risks, benefits and alternatives for the proposed anesthesia with the patient or authorized representative who has indicated his/her understanding and acceptance.     Dental advisory given  Plan Discussed with: CRNA and Anesthesiologist  Anesthesia Plan Comments: (Risks of general anesthesia discussed including, but not limited to, sore throat, hoarse voice, chipped/damaged teeth, injury to vocal cords, nausea and vomiting, allergic reactions, lung infection, heart attack, stroke, and death. All questions answered. )       Anesthesia Quick Evaluation

## 2023-03-16 NOTE — H&P (Signed)
 REFERRING PHYSICIAN:  Self PROVIDER:  Matthias Hughs, MD MRN: [id number] DOB: 1970-04-30 DATE OF ENCOUNTER: 03/09/2023 Subjective    Plan Chief Complaint: New Consultation   History of Present Illness: Caroline Novak is a 53 y.o. female who is seen today as an office consultation for evaluation of New Consultation     Patient is referred February 2025 for periodic vision loss and headaches.  The patient was in her usual state of health till around 2 to 3 months ago when she woke up and had some visual changes.  She states that upon awakening she put her glasses on and normally she has crisp vision but 1 side felt like it was shaded out.  This has periodically occurred on and off since that time.  At first it was not more than once a week but now is every few days.  This change occurs on both eyes.  She also has a frontal headache intermittently that does not seem to correlate with the time of the visual changes.  She has seen Dr. Lucia Gaskins and started on steroids.  There has not been adequate time for her to determine if this has made an improvement or not.  She was set up for an MRI but that did not occur last week due to the weather.   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     Review of Systems: A complete review of systems was obtained from the patient.  I have reviewed this information and discussed as appropriate with the patient.  See HPI as well for other ROS.   Review of Systems  HENT:  Positive for tinnitus.   Neurological:  Positive for headaches.  All other systems reviewed and are negative.     Medical History: Past Medical History      Past Medical History:  Diagnosis Date   GERD (gastroesophageal reflux disease)     Hypertension          Problem List     Patient Active Problem List  Diagnosis   Vision loss   Elevated erythrocyte sedimentation rate   Elevated C-reactive protein (CRP)   Frequent  headaches   Ramsay Hunt syndrome (geniculate herpes zoster)        Past Surgical History       Past Surgical History:  Procedure Laterality Date   HYSTERECTOMY            Allergies      Allergies  Allergen Reactions   Lisinopril Anaphylaxis, Other (See Comments), Itching, Nausea, Nausea And Vomiting, Rash and Swelling        Medications Ordered Prior to Encounter        Current Outpatient Medications on File Prior to Visit  Medication Sig Dispense Refill   amLODIPine (NORVASC) 10 MG tablet TAKE 1 TABLET BY MOUTH EVERY DAY FOR 90 DAYS for 90 days       hydroCHLOROthiazide (MICROZIDE) 12.5 mg capsule Take 12.5 mg by mouth once daily       predniSONE (DELTASONE) 20 MG tablet          No current facility-administered medications on file prior to visit.        Family History       Family History  Problem Relation Age of Onset   Obesity Mother     High blood pressure (Hypertension) Mother     High blood pressure (Hypertension) Father  Tobacco Use History  Social History       Tobacco Use  Smoking Status Never  Smokeless Tobacco Never        Social History  Social History        Socioeconomic History   Marital status: Single  Tobacco Use   Smoking status: Never   Smokeless tobacco: Never  Substance and Sexual Activity   Alcohol use: Never   Drug use: Never    Social Drivers of Health        Housing Stability: Unknown (03/09/2023)    Housing Stability Vital Sign     Homeless in the Last Year: No        Objective:        Vitals:    03/09/23 1333 03/09/23 1334  BP: 137/82    Pulse: 73    Temp: 36.7 C (98 F)    SpO2: 99%    Weight: (!) 112.7 kg (248 lb 6.4 oz)    Height: 157.5 cm (5\' 2" )    PainSc:   0-No pain    Body mass index is 45.43 kg/m.     Head:   Normocephalic and atraumatic.  Mouth/Throat: Oropharynx is clear and moist. No oropharyngeal exudate.  Eyes:   Conjunctivae are normal. Pupils are equal, round, and reactive  to light. No scleral icterus.  Neck:   Normal range of motion. Neck supple. No tracheal deviation present. No thyromegaly present.  Cardiovascular: Normal rate, regular rhythm, normal heart sounds and intact distal pulses.  Exam reveals no gallop and no friction rub.  Faint bilateral temporal artery pulse No murmur heard. Respiratory: Effort normal and breath sounds normal. No respiratory distress. No wheezes, rales or rhonchi.  No chest wall tenderness.  GI:       Soft. Bowel sounds are normal. Abdomen is soft, non tender, non distended.  No masses or hepatosplenomegaly is present. There is no rebound and no guarding.  Musculoskeletal: . Extremities are non tender and without deformity.  Lymphadenopathy:   No cervical or axillary adenopathy.  Neurological: Alert and oriented to person, place, and time. Coordination normal.  Skin:    Skin is warm and dry. No rash noted. No diaphoresis. No erythema. No pallor.  Psychiatric: Normal mood and affect.Behavior is normal. Judgment and thought content normal.    Labs, Imaging and Diagnostic Testing: ESR 71, CRP 20   Assessment and Plan:    Assessment Diagnoses and all orders for this visit:   Vision loss   Frequent headaches   Elevated erythrocyte sedimentation rate   Elevated C-reactive protein (CRP)   Ramsay Hunt syndrome (geniculate herpes zoster)     Patient is going to continue steroids until we have her temporal artery biopsy.  She would like to wait until next week so that she can get the MRI results back.  She also is trying to stretch out her losses from work.  I discussed temporal artery biopsy with the patient.  I reviewed risk of wound issues and risk of facial nerve injury.  She would like to try to focus on the left if possible since she already has Ramsay Hunt syndrome on that side.  Since her visual changes have been on both sides, I certainly could shoot for the left, however if on the day of surgery there is a poor pulse on  that side, I would need to go on the opposite side.   I discussed that I would need to shave a small amount of hair  on whichever side I go, but that I would traced out the artery prior to doing this to minimize the amount of hair that I removed.  The patient does have braids that are woven in, I discussed that I certainly could just shave up to the edge of the parade and give them back to her and that she certainly does not need to take them all out before surgery unless she desires to.

## 2023-03-16 NOTE — Telephone Encounter (Signed)
 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 The MRI brain and optic nerve are normal it just appears to have too much fluid around the brain.  I do think we have to go forward with the lumbar puncture as well as a CT of the veins in her brain to get the answer. In the meantime ruling out the temporal arteritis (I see she has an appointment with the surgeon) is also still recommended.  Please let her know and if she is ok with that, I can order the lumbar puncture and the CTV (we discussed all of this at appointment)  Let me know, thank you

## 2023-03-16 NOTE — Progress Notes (Signed)
 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 The MRI brain and optic nerve are normal it just appears to have too much fluid around the brain.  I do think we have to go forward with the lumbar puncture as well as a CT of the veins in her brain to get the answer. In the meantime ruling out the temporal arteritis (I see she has an appointment with the surgeon) is also still recommended.  Please let her know and if she is ok with that, I can order the lumbar puncture and the CTV (we discussed all of this at appointment)  Let me know, thank you

## 2023-03-16 NOTE — Telephone Encounter (Signed)
LMVM for pt to return call for results.  ?

## 2023-03-17 ENCOUNTER — Ambulatory Visit (HOSPITAL_BASED_OUTPATIENT_CLINIC_OR_DEPARTMENT_OTHER): Payer: Self-pay | Admitting: Anesthesiology

## 2023-03-17 ENCOUNTER — Encounter (HOSPITAL_BASED_OUTPATIENT_CLINIC_OR_DEPARTMENT_OTHER): Payer: Self-pay | Admitting: General Surgery

## 2023-03-17 ENCOUNTER — Other Ambulatory Visit: Payer: Self-pay

## 2023-03-17 ENCOUNTER — Ambulatory Visit (HOSPITAL_BASED_OUTPATIENT_CLINIC_OR_DEPARTMENT_OTHER)
Admission: RE | Admit: 2023-03-17 | Discharge: 2023-03-17 | Disposition: A | Discharge status: Home / Self Care | Payer: Federal, State, Local not specified - PPO | Attending: General Surgery | Admitting: General Surgery

## 2023-03-17 ENCOUNTER — Encounter (HOSPITAL_BASED_OUTPATIENT_CLINIC_OR_DEPARTMENT_OTHER)
Admission: RE | Disposition: A | Discharge status: Home / Self Care | Payer: Self-pay | Source: Home / Self Care | Attending: General Surgery

## 2023-03-17 DIAGNOSIS — H547 Unspecified visual loss: Secondary | ICD-10-CM | POA: Diagnosis not present

## 2023-03-17 DIAGNOSIS — I10 Essential (primary) hypertension: Secondary | ICD-10-CM | POA: Diagnosis not present

## 2023-03-17 DIAGNOSIS — R519 Headache, unspecified: Secondary | ICD-10-CM | POA: Insufficient documentation

## 2023-03-17 DIAGNOSIS — R700 Elevated erythrocyte sedimentation rate: Secondary | ICD-10-CM | POA: Diagnosis not present

## 2023-03-17 DIAGNOSIS — Z01818 Encounter for other preprocedural examination: Secondary | ICD-10-CM

## 2023-03-17 DIAGNOSIS — Z79899 Other long term (current) drug therapy: Secondary | ICD-10-CM | POA: Insufficient documentation

## 2023-03-17 DIAGNOSIS — H538 Other visual disturbances: Secondary | ICD-10-CM | POA: Diagnosis not present

## 2023-03-17 DIAGNOSIS — B0221 Postherpetic geniculate ganglionitis: Secondary | ICD-10-CM | POA: Diagnosis not present

## 2023-03-17 DIAGNOSIS — E66813 Obesity, class 3: Secondary | ICD-10-CM | POA: Insufficient documentation

## 2023-03-17 DIAGNOSIS — R7982 Elevated C-reactive protein (CRP): Secondary | ICD-10-CM | POA: Insufficient documentation

## 2023-03-17 DIAGNOSIS — Z6841 Body Mass Index (BMI) 40.0 and over, adult: Secondary | ICD-10-CM | POA: Diagnosis not present

## 2023-03-17 HISTORY — DX: Headache, unspecified: R51.9

## 2023-03-17 HISTORY — PX: ARTERY BIOPSY: SHX891

## 2023-03-17 HISTORY — DX: Gastro-esophageal reflux disease without esophagitis: K21.9

## 2023-03-17 SURGERY — BIOPSY TEMPORAL ARTERY
Anesthesia: General | Site: Head | Laterality: Left

## 2023-03-17 MED ORDER — ATROPINE SULFATE 0.4 MG/ML IV SOLN
INTRAVENOUS | Status: AC
  Filled 2023-03-17: qty 1

## 2023-03-17 MED ORDER — FENTANYL CITRATE (PF) 100 MCG/2ML IJ SOLN: 25.0000 ug | INTRAMUSCULAR | Status: DC | PRN

## 2023-03-17 MED ORDER — AMISULPRIDE (ANTIEMETIC) 5 MG/2ML IV SOLN: 10.0000 mg | Freq: Once | INTRAVENOUS | Status: DC | PRN

## 2023-03-17 MED ORDER — LIDOCAINE 2% (20 MG/ML) 5 ML SYRINGE
INTRAMUSCULAR | Status: AC
Start: 2023-03-17 — End: ?
  Filled 2023-03-17: qty 5

## 2023-03-17 MED ORDER — ONDANSETRON HCL 4 MG/2ML IJ SOLN
INTRAMUSCULAR | Status: DC | PRN
  Administered 2023-03-17: 4 mg via INTRAVENOUS

## 2023-03-17 MED ORDER — OXYCODONE HCL 5 MG PO TABS: 5.0000 mg | ORAL_TABLET | Freq: Four times a day (QID) | ORAL | 0 refills | Status: AC | PRN

## 2023-03-17 MED ORDER — BUPIVACAINE-EPINEPHRINE (PF) 0.5% -1:200000 IJ SOLN
INTRAMUSCULAR | Status: AC
  Filled 2023-03-17: qty 30

## 2023-03-17 MED ORDER — LIDOCAINE HCL (CARDIAC) PF 100 MG/5ML IV SOSY
PREFILLED_SYRINGE | INTRAVENOUS | Status: DC | PRN
  Administered 2023-03-17: 100 mg via INTRAVENOUS

## 2023-03-17 MED ORDER — FENTANYL CITRATE (PF) 100 MCG/2ML IJ SOLN
INTRAMUSCULAR | Status: DC | PRN
  Administered 2023-03-17: 25 ug via INTRAVENOUS
  Administered 2023-03-17: 50 ug via INTRAVENOUS

## 2023-03-17 MED ORDER — CEFAZOLIN SODIUM-DEXTROSE 2-4 GM/100ML-% IV SOLN
INTRAVENOUS | Status: AC
  Filled 2023-03-17: qty 100

## 2023-03-17 MED ORDER — BUPIVACAINE HCL (PF) 0.5 % IJ SOLN
INTRAMUSCULAR | Status: AC
  Filled 2023-03-17: qty 30

## 2023-03-17 MED ORDER — LACTATED RINGERS IV SOLN: INTRAVENOUS | Status: DC

## 2023-03-17 MED ORDER — LIDOCAINE-EPINEPHRINE (PF) 1 %-1:200000 IJ SOLN
INTRAMUSCULAR | Status: DC | PRN
  Administered 2023-03-17: 10 mL

## 2023-03-17 MED ORDER — OXYCODONE HCL 5 MG PO TABS
ORAL_TABLET | ORAL | Status: AC
  Filled 2023-03-17: qty 1

## 2023-03-17 MED ORDER — OXYCODONE HCL 5 MG/5ML PO SOLN: 5.0000 mg | Freq: Once | ORAL | Status: AC | PRN

## 2023-03-17 MED ORDER — ACETAMINOPHEN 500 MG PO TABS
ORAL_TABLET | ORAL | Status: AC
  Filled 2023-03-17: qty 2

## 2023-03-17 MED ORDER — SUCCINYLCHOLINE CHLORIDE 200 MG/10ML IV SOSY
PREFILLED_SYRINGE | INTRAVENOUS | Status: AC
  Filled 2023-03-17: qty 10

## 2023-03-17 MED ORDER — ACETAMINOPHEN 500 MG PO TABS
1000.0000 mg | ORAL_TABLET | ORAL | Status: AC
  Administered 2023-03-17: 1000 mg via ORAL

## 2023-03-17 MED ORDER — DEXAMETHASONE SODIUM PHOSPHATE 4 MG/ML IJ SOLN
INTRAMUSCULAR | Status: DC | PRN
  Administered 2023-03-17: 5 mg via INTRAVENOUS

## 2023-03-17 MED ORDER — LIDOCAINE-EPINEPHRINE (PF) 1 %-1:200000 IJ SOLN
INTRAMUSCULAR | Status: AC
  Filled 2023-03-17: qty 30

## 2023-03-17 MED ORDER — PROPOFOL 10 MG/ML IV BOLUS
INTRAVENOUS | Status: DC | PRN
  Administered 2023-03-17: 200 mg via INTRAVENOUS

## 2023-03-17 MED ORDER — CEFAZOLIN SODIUM-DEXTROSE 2-4 GM/100ML-% IV SOLN
2.0000 g | INTRAVENOUS | Status: AC
  Administered 2023-03-17: 2 g via INTRAVENOUS

## 2023-03-17 MED ORDER — OXYCODONE HCL 5 MG PO TABS
5.0000 mg | ORAL_TABLET | Freq: Once | ORAL | Status: AC | PRN
  Administered 2023-03-17: 5 mg via ORAL

## 2023-03-17 MED ORDER — ONDANSETRON HCL 4 MG/2ML IJ SOLN
INTRAMUSCULAR | Status: AC
  Filled 2023-03-17: qty 2

## 2023-03-17 MED ORDER — FENTANYL CITRATE (PF) 100 MCG/2ML IJ SOLN
INTRAMUSCULAR | Status: AC
  Filled 2023-03-17: qty 2

## 2023-03-17 MED ORDER — PHENYLEPHRINE 80 MCG/ML (10ML) SYRINGE FOR IV PUSH (FOR BLOOD PRESSURE SUPPORT)
PREFILLED_SYRINGE | INTRAVENOUS | Status: AC
  Filled 2023-03-17: qty 10

## 2023-03-17 MED ORDER — EPHEDRINE 5 MG/ML INJ
INTRAVENOUS | Status: AC
  Filled 2023-03-17: qty 5

## 2023-03-17 MED ORDER — BUPIVACAINE HCL (PF) 0.25 % IJ SOLN
INTRAMUSCULAR | Status: AC
  Filled 2023-03-17: qty 60

## 2023-03-17 MED ORDER — MIDAZOLAM HCL 5 MG/5ML IJ SOLN
INTRAMUSCULAR | Status: DC | PRN
  Administered 2023-03-17: 2 mg via INTRAVENOUS

## 2023-03-17 MED ORDER — MIDAZOLAM HCL 2 MG/2ML IJ SOLN
INTRAMUSCULAR | Status: AC
  Filled 2023-03-17: qty 2

## 2023-03-17 MED ORDER — TRANEXAMIC ACID 1000 MG/10ML IV SOLN
INTRAVENOUS | Status: AC
  Filled 2023-03-17: qty 30

## 2023-03-17 MED ORDER — DEXAMETHASONE SODIUM PHOSPHATE 10 MG/ML IJ SOLN
INTRAMUSCULAR | Status: AC
  Filled 2023-03-17: qty 1

## 2023-03-17 SURGICAL SUPPLY — 37 items
BLADE CLIPPER SURG (BLADE) ×1 IMPLANT
BLADE HEX COATED 2.75 (ELECTRODE) ×1 IMPLANT
BLADE SURG 15 STRL LF DISP TIS (BLADE) ×1 IMPLANT
CANISTER SUCT 1200ML W/VALVE (MISCELLANEOUS) IMPLANT
COTTONBALL LRG STERILE PKG (GAUZE/BANDAGES/DRESSINGS) ×1 IMPLANT
COVER BACK TABLE 60X90IN (DRAPES) ×1 IMPLANT
COVER MAYO STAND STRL (DRAPES) ×1 IMPLANT
DERMABOND ADVANCED .7 DNX12 (GAUZE/BANDAGES/DRESSINGS) ×1 IMPLANT
DRAPE LAPAROTOMY 100X72 PEDS (DRAPES) ×1 IMPLANT
DRAPE UTILITY XL STRL (DRAPES) IMPLANT
ELECT COATED BLADE 2.86 ST (ELECTRODE) IMPLANT
ELECT REM PT RETURN 9FT ADLT (ELECTROSURGICAL) ×1 IMPLANT
ELECTRODE REM PT RTRN 9FT ADLT (ELECTROSURGICAL) ×1 IMPLANT
GLOVE BIO SURGEON STRL SZ 6 (GLOVE) ×1 IMPLANT
GLOVE BIOGEL PI IND STRL 6.5 (GLOVE) ×1 IMPLANT
GLOVE BIOGEL PI IND STRL 7.0 (GLOVE) IMPLANT
GLOVE SURG SS PI 7.0 STRL IVOR (GLOVE) IMPLANT
GOWN STRL REUS W/ TWL LRG LVL3 (GOWN DISPOSABLE) ×1 IMPLANT
GOWN STRL REUS W/ TWL XL LVL3 (GOWN DISPOSABLE) ×1 IMPLANT
NDL HYPO 25X1 1.5 SAFETY (NEEDLE) ×1 IMPLANT
NEEDLE HYPO 25X1 1.5 SAFETY (NEEDLE) ×1 IMPLANT
NS IRRIG 1000ML POUR BTL (IV SOLUTION) ×1 IMPLANT
PACK BASIN DAY SURGERY FS (CUSTOM PROCEDURE TRAY) ×1 IMPLANT
PENCIL SMOKE EVACUATOR (MISCELLANEOUS) ×1 IMPLANT
SLEEVE SCD COMPRESS KNEE MED (STOCKING) ×1 IMPLANT
SPIKE FLUID TRANSFER (MISCELLANEOUS) IMPLANT
SPONGE T-LAP 18X18 ~~LOC~~+RFID (SPONGE) IMPLANT
SUT MNCRL AB 4-0 PS2 18 (SUTURE) ×1 IMPLANT
SUT SILK 4 0 TIES 17X18 (SUTURE) ×1 IMPLANT
SUT VIC AB 3-0 SH 27X BRD (SUTURE) ×1 IMPLANT
SUT VIC AB 4-0 PS2 18 (SUTURE) IMPLANT
SYR BULB EAR ULCER 3OZ GRN STR (SYRINGE) IMPLANT
SYR CONTROL 10ML LL (SYRINGE) ×1 IMPLANT
TOWEL GREEN STERILE FF (TOWEL DISPOSABLE) ×1 IMPLANT
TRAY DSU PREP LF (CUSTOM PROCEDURE TRAY) ×1 IMPLANT
TUBE CONNECTING 20X1/4 (TUBING) IMPLANT
YANKAUER SUCT BULB TIP NO VENT (SUCTIONS) IMPLANT

## 2023-03-17 NOTE — Anesthesia Postprocedure Evaluation (Signed)
 Anesthesia Post Note  Patient: Caroline Novak  Procedure(s) Performed: LEFT TEMPORAL ARTERY BIOPSY (Left: Head)     Patient location during evaluation: PACU Anesthesia Type: General Level of consciousness: awake Pain management: pain level controlled Vital Signs Assessment: post-procedure vital signs reviewed and stable Respiratory status: spontaneous breathing, nonlabored ventilation and respiratory function stable Cardiovascular status: blood pressure returned to baseline and stable Postop Assessment: no apparent nausea or vomiting Anesthetic complications: no   No notable events documented.  Last Vitals:  Vitals:   03/17/23 1030 03/17/23 1038  BP:  123/87  Pulse: (!) 58 62  Resp: 14 16  Temp:  (!) 36.1 C  SpO2: 94% 98%    Last Pain:  Vitals:   03/17/23 1038  TempSrc:   PainSc: 3                  Linton Rump

## 2023-03-17 NOTE — Interval H&P Note (Signed)
 History and Physical Interval Note:  03/17/2023 8:37 AM  Caroline Novak  has presented today for surgery, with the diagnosis of HEADACHES, VISION LOSS.  The various methods of treatment have been discussed with the patient and family. After consideration of risks, benefits and other options for treatment, the patient has consented to  Procedure(s): BIOPSY TEMPORAL ARTERY (N/A) as a surgical intervention.  The patient's history has been reviewed, patient examined, no change in status, stable for surgery.  I have reviewed the patient's chart and labs.  Questions were answered to the patient's satisfaction.     Almond Lint

## 2023-03-17 NOTE — Telephone Encounter (Signed)
 LMVM for pt to return call about results.

## 2023-03-17 NOTE — Progress Notes (Signed)
 Last dose of oxycodone given at 1050am written on dc paperwork.

## 2023-03-17 NOTE — Op Note (Signed)
 PRE-OPERATIVE DIAGNOSIS: headaches, visual changes, concern for temporal arteritis  POST-OPERATIVE DIAGNOSIS:  Same  PROCEDURE:  Procedure(s): left temporal artery biopsy  SURGEON:  Surgeon(s): Almond Lint, MD  ANESTHESIA:   General  FINDINGS:  robust artery  LOCAL MEDICATIONS USED:  BUPIVICAINE  and LIDOCAINE   SPECIMEN:  Source of Specimen:  left temporal artery segment  DISPOSITION OF SPECIMEN:  PATHOLOGY  COUNTS:  YES  PLAN OF CARE: Discharge to home after PACU  PATIENT DISPOSITION:  PACU - hemodynamically stable.   PROCEDURE:   The patient was identified in the holding area, taken to the operating room, and was placed supine on the operating room table.  The patient had a history of bilateral headaches, but had history of left facial nerve palsy and so we opted to focus on the left side.  The left temporal artery was traced out with the Doppler.   The hair was clipped and the lateral upper face was prepped and draped in sterile fashion.  Timeout was performed according to the surgical safety checklist. When all was correct, we continued.  Local anesthetic was infiltrated in the skin above the temporal artery.  A longitudinal incision proximally 3 cm was made with the #15 blade.  Alms self retaining retractors were used to assist with visualization. The temporal artery was skeletonized with the mosquito clamp. Side branches of the temporal artery were ligated with 4-0 silk ties  A 2 cm segment of the artery was taken.   Hemostasis was achieved with cautery.    The incision was closed with 3-0 vicryl interrupted deep dermal sutures and 4-0 monocryl running subcuticular suture.  The wound was cleaned, dried, and dressed with dermabond.    Needle, sponge, and instrument counts were correct.  Pt emerged from anesthesia and was taken to PACU in stable condition.

## 2023-03-17 NOTE — Discharge Instructions (Addendum)
 [facility] Office Phone Number [phone]   POST OP INSTRUCTIONS  Always review your discharge instruction sheet given to you by the facility where your surgery was performed.  IF YOU HAVE DISABILITY OR FAMILY LEAVE FORMS, YOU MUST BRING THEM TO THE OFFICE FOR PROCESSING.  DO NOT GIVE THEM TO YOUR DOCTOR.  Take 2 tylenol (acetominophen) three times a day for 3 days.  If you still have pain, add ibuprofen with food in between if able to take this (if you have kidney issues or stomach issues, do not take ibuprofen).  If both of those are not enough, add the narcotic pain pill.  If you find you are needing a lot of this overnight after surgery, call the next morning for a refill.   Take your usually prescribed medications unless otherwise directed If you need a refill on your pain medication, please contact your pharmacy.  They will contact our office to request authorization.  Prescriptions will not be filled after 5pm or on week-ends. You should eat very light the first 24 hours after surgery, such as soup, crackers, pudding, etc.  Resume your normal diet the day after surgery It is common to experience some constipation if taking pain medication after surgery.  Increasing fluid intake and taking a stool softener will usually help or prevent this problem from occurring.  A mild laxative (Milk of Magnesia or Miralax) should be taken according to package directions if there are no bowel movements after 48 hours. You may shower in 48 hours.  The surgical glue will flake off in 2-3 weeks.   ACTIVITIES:  No strenuous activity or heavy lifting for 1 week.   You may drive when you no longer are taking prescription pain medication, you can comfortably wear a seatbelt, and you can safely maneuver your car and apply brakes. RETURN TO WORK:  __________monday march 10_______________ You should see your doctor in the office for a follow-up appointment approximately three-four weeks after your  surgery.    WHEN TO CALL YOUR DOCTOR: Fever over 101.0 Nausea and/or vomiting. Extreme swelling or bruising. Continued bleeding from incision. Increased pain, redness, or drainage from the incision.  The clinic staff is available to answer your questions during regular business hours.  Please don't hesitate to call and ask to speak to one of the nurses for clinical concerns.  If you have a medical emergency, go to the nearest emergency room or call 911.  A surgeon from [facility] is always on call at the hospital.  For further questions, please visit [email or web address]   Post Anesthesia Home Care Instructions  Activity: Get plenty of rest for the remainder of the day. A responsible individual must stay with you for 24 hours following the procedure.  For the next 24 hours, DO NOT: -Drive a car -Advertising copywriter -Drink alcoholic beverages -Take any medication unless instructed by your physician -Make any legal decisions or sign important papers.  Meals: Start with liquid foods such as gelatin or soup. Progress to regular foods as tolerated. Avoid greasy, spicy, heavy foods. If nausea and/or vomiting occur, drink only clear liquids until the nausea and/or vomiting subsides. Call your physician if vomiting continues.  Special Instructions/Symptoms: Your throat may feel dry or sore from the anesthesia or the breathing tube placed in your throat during surgery. If this causes discomfort, gargle with warm salt water. The discomfort should disappear within 24 hours.  If you had a scopolamine patch placed behind your ear for the management  of post- operative nausea and/or vomiting:  1. The medication in the patch is effective for 72 hours, after which it should be removed.  Wrap patch in a tissue and discard in the trash. Wash hands thoroughly with soap and water. 2. You may remove the patch earlier than 72 hours if you experience unpleasant side effects which may  include dry mouth, dizziness or visual disturbances. 3. Avoid touching the patch. Wash your hands with soap and water after contact with the patch.      Next dose of tylenol may be taken at 2p

## 2023-03-17 NOTE — Transfer of Care (Signed)
 Immediate Anesthesia Transfer of Care Note  Patient: Caroline Novak  Procedure(s) Performed: LEFT TEMPORAL ARTERY BIOPSY (Left: Head)  Patient Location: PACU  Anesthesia Type:General  Level of Consciousness: awake, alert , oriented, drowsy, and patient cooperative  Airway & Oxygen Therapy: Patient Spontanous Breathing and Patient connected to face mask oxygen  Post-op Assessment: Report given to RN and Post -op Vital signs reviewed and stable  Post vital signs: Reviewed and stable  Last Vitals:  Vitals Value Taken Time  BP    Temp    Pulse    Resp    SpO2      Last Pain:  Vitals:   03/17/23 0725  TempSrc: Temporal  PainSc: 0-No pain         Complications: No notable events documented.

## 2023-03-17 NOTE — Anesthesia Procedure Notes (Signed)
 Procedure Name: LMA Insertion Date/Time: 03/17/2023 8:58 AM  Performed by: Ronnette Hila, CRNAPre-anesthesia Checklist: Patient identified, Emergency Drugs available, Suction available and Patient being monitored Patient Re-evaluated:Patient Re-evaluated prior to induction Oxygen Delivery Method: Circle System Utilized Preoxygenation: Pre-oxygenation with 100% oxygen Induction Type: IV induction Ventilation: Mask ventilation without difficulty LMA: LMA inserted LMA Size: 4.0 Number of attempts: 1 Airway Equipment and Method: bite block Placement Confirmation: positive ETCO2 Tube secured with: Tape Dental Injury: Teeth and Oropharynx as per pre-operative assessment

## 2023-03-18 ENCOUNTER — Encounter (HOSPITAL_BASED_OUTPATIENT_CLINIC_OR_DEPARTMENT_OTHER): Payer: Self-pay | Admitting: General Surgery

## 2023-03-18 LAB — SURGICAL PATHOLOGY

## 2023-03-18 NOTE — Telephone Encounter (Signed)
 Pt returning call. I gave her the results of the MRI brain and orbits as per below.  She had the biopsy done yesterday (checking for temporal arteritis).  She relayed understanding of results, is ok to proceed with LP and CTV.  Will call her with results.  Relayed instructions about LP, post LP/blood patch.  She did not have any questions at this time. Appreciated call.

## 2023-03-18 NOTE — Addendum Note (Signed)
 Addended by: Naomie Dean B on: 03/18/2023 03:59 PM   Modules accepted: Orders

## 2023-03-18 NOTE — Telephone Encounter (Signed)
 Called pt and LVM (ok per DPR) reminding her that LP was actually already scheduled for 3/12 at 10 am at [facility]. If pt has questions about that test she can call them at [phone]. I informed pt that as soon as CT-V is ready for scheduling she will be called.

## 2023-03-18 NOTE — Addendum Note (Signed)
 Addended by: Guy Begin on: 03/18/2023 10:19 AM   Modules accepted: Orders

## 2023-03-18 NOTE — Telephone Encounter (Signed)
 Looks like I already had ordered the LP I hope patient is monitoring her messages bc GI should have already called for it. thanks

## 2023-03-23 ENCOUNTER — Telehealth: Payer: Self-pay | Admitting: Neurology

## 2023-03-23 NOTE — Telephone Encounter (Signed)
No auth required sent to GI [phone]

## 2023-03-25 ENCOUNTER — Inpatient Hospital Stay: Admission: RE | Admit: 2023-03-25 | Payer: Federal, State, Local not specified - PPO | Source: Ambulatory Visit

## 2023-04-02 DIAGNOSIS — G932 Benign intracranial hypertension: Secondary | ICD-10-CM | POA: Diagnosis not present

## 2023-04-02 DIAGNOSIS — H4710 Unspecified papilledema: Secondary | ICD-10-CM | POA: Diagnosis not present

## 2023-04-02 DIAGNOSIS — H5319 Other subjective visual disturbances: Secondary | ICD-10-CM | POA: Diagnosis not present

## 2023-04-02 NOTE — Discharge Instructions (Signed)

## 2023-04-03 ENCOUNTER — Ambulatory Visit
Admission: RE | Admit: 2023-04-03 | Discharge: 2023-04-03 | Disposition: A | Discharge status: Home / Self Care | Source: Ambulatory Visit | Attending: Neurology | Admitting: Neurology

## 2023-04-03 DIAGNOSIS — H4710 Unspecified papilledema: Secondary | ICD-10-CM

## 2023-04-03 DIAGNOSIS — G932 Benign intracranial hypertension: Secondary | ICD-10-CM

## 2023-04-03 DIAGNOSIS — G8929 Other chronic pain: Secondary | ICD-10-CM | POA: Diagnosis not present

## 2023-04-03 DIAGNOSIS — R519 Headache, unspecified: Secondary | ICD-10-CM

## 2023-04-05 ENCOUNTER — Encounter: Payer: Self-pay | Admitting: Neurology

## 2023-04-12 LAB — CSF CELL COUNT WITH DIFFERENTIAL
RBC Count, CSF: 2 {cells}/uL — ABNORMAL HIGH
TOTAL NUCLEATED CELL: 0 {cells}/uL (ref 0–5)

## 2023-04-12 LAB — GLUCOSE, CSF: Glucose, CSF: 69 mg/dL (ref 40–80)

## 2023-04-12 LAB — PROTEIN, CSF: Total Protein, CSF: 34 mg/dL (ref 15–45)

## 2023-05-21 ENCOUNTER — Telehealth: Payer: Self-pay | Admitting: Neurology

## 2023-05-21 NOTE — Telephone Encounter (Signed)
 Patient is doing well, all testing was normal, she is asymptomatic. I spoke with her because I am going to be late tomorrow, we agreed to push out her appointment possibly to the end of August. I have some Friday's that I am going to be opening and she prefers a Friday morning if you could call and offer this lovely patient whatever she wants, thank you !!

## 2023-05-22 ENCOUNTER — Ambulatory Visit: Payer: Federal, State, Local not specified - PPO | Admitting: Neurology

## 2023-05-29 ENCOUNTER — Other Ambulatory Visit: Payer: Self-pay | Admitting: Family Medicine

## 2023-05-29 DIAGNOSIS — Z1231 Encounter for screening mammogram for malignant neoplasm of breast: Secondary | ICD-10-CM

## 2023-07-03 ENCOUNTER — Ambulatory Visit
Admission: RE | Admit: 2023-07-03 | Discharge: 2023-07-03 | Disposition: A | Discharge status: Home / Self Care | Source: Ambulatory Visit | Attending: Family Medicine | Admitting: Family Medicine

## 2023-07-03 DIAGNOSIS — Z1231 Encounter for screening mammogram for malignant neoplasm of breast: Secondary | ICD-10-CM

## 2023-08-05 ENCOUNTER — Ambulatory Visit: Admitting: Neurology

## 2023-09-04 ENCOUNTER — Ambulatory Visit: Admitting: Neurology

## 2023-11-09 DIAGNOSIS — H04123 Dry eye syndrome of bilateral lacrimal glands: Secondary | ICD-10-CM | POA: Diagnosis not present

## 2023-11-09 DIAGNOSIS — H4710 Unspecified papilledema: Secondary | ICD-10-CM | POA: Diagnosis not present

## 2023-11-09 DIAGNOSIS — H5319 Other subjective visual disturbances: Secondary | ICD-10-CM | POA: Diagnosis not present

## 2023-11-09 DIAGNOSIS — G932 Benign intracranial hypertension: Secondary | ICD-10-CM | POA: Diagnosis not present

## 2023-11-24 DIAGNOSIS — I10 Essential (primary) hypertension: Secondary | ICD-10-CM | POA: Diagnosis not present

## 2023-11-24 DIAGNOSIS — Z23 Encounter for immunization: Secondary | ICD-10-CM | POA: Diagnosis not present

## 2023-11-24 DIAGNOSIS — E785 Hyperlipidemia, unspecified: Secondary | ICD-10-CM | POA: Diagnosis not present

## 2023-11-24 DIAGNOSIS — Z0000 Encounter for general adult medical examination without abnormal findings: Secondary | ICD-10-CM | POA: Diagnosis not present
